# Patient Record
Sex: Male | Born: 1935 | Race: White | Hispanic: No | Marital: Married | Smoking: Never smoker
Health system: Southern US, Community
[De-identification: ages and names within clinical notes are randomized; demographics above are authoritative.]

## PROBLEM LIST (undated history)

## (undated) DIAGNOSIS — I10 Essential (primary) hypertension: Secondary | ICD-10-CM

## (undated) DIAGNOSIS — E785 Hyperlipidemia, unspecified: Secondary | ICD-10-CM

## (undated) DIAGNOSIS — I2510 Atherosclerotic heart disease of native coronary artery without angina pectoris: Secondary | ICD-10-CM

## (undated) HISTORY — PX: ARTERIAL BYPASS SURGRY: SHX557

## (undated) HISTORY — PX: HERNIA REPAIR: SHX51

---

## 2015-10-05 ENCOUNTER — Inpatient Hospital Stay
Admission: EM | Admit: 2015-10-05 | Discharge: 2015-10-08 | DRG: 872 | Disposition: A | Discharge status: Home / Self Care | Payer: Medicare Other | Attending: Internal Medicine | Admitting: Internal Medicine

## 2015-10-05 ENCOUNTER — Encounter: Payer: Self-pay | Admitting: Medical Oncology

## 2015-10-05 ENCOUNTER — Emergency Department: Payer: Medicare Other

## 2015-10-05 DIAGNOSIS — A419 Sepsis, unspecified organism: Secondary | ICD-10-CM | POA: Diagnosis present

## 2015-10-05 DIAGNOSIS — E860 Dehydration: Secondary | ICD-10-CM | POA: Diagnosis present

## 2015-10-05 DIAGNOSIS — Z79899 Other long term (current) drug therapy: Secondary | ICD-10-CM

## 2015-10-05 DIAGNOSIS — I959 Hypotension, unspecified: Secondary | ICD-10-CM

## 2015-10-05 DIAGNOSIS — A412 Sepsis due to unspecified staphylococcus: Secondary | ICD-10-CM | POA: Diagnosis not present

## 2015-10-05 DIAGNOSIS — N390 Urinary tract infection, site not specified: Secondary | ICD-10-CM | POA: Diagnosis present

## 2015-10-05 DIAGNOSIS — R7989 Other specified abnormal findings of blood chemistry: Secondary | ICD-10-CM

## 2015-10-05 DIAGNOSIS — Z8249 Family history of ischemic heart disease and other diseases of the circulatory system: Secondary | ICD-10-CM | POA: Diagnosis not present

## 2015-10-05 DIAGNOSIS — I10 Essential (primary) hypertension: Secondary | ICD-10-CM | POA: Diagnosis present

## 2015-10-05 DIAGNOSIS — I2510 Atherosclerotic heart disease of native coronary artery without angina pectoris: Secondary | ICD-10-CM | POA: Diagnosis present

## 2015-10-05 DIAGNOSIS — E876 Hypokalemia: Secondary | ICD-10-CM | POA: Diagnosis present

## 2015-10-05 DIAGNOSIS — R17 Unspecified jaundice: Secondary | ICD-10-CM | POA: Diagnosis present

## 2015-10-05 DIAGNOSIS — N179 Acute kidney failure, unspecified: Secondary | ICD-10-CM | POA: Diagnosis present

## 2015-10-05 DIAGNOSIS — E785 Hyperlipidemia, unspecified: Secondary | ICD-10-CM | POA: Diagnosis present

## 2015-10-05 DIAGNOSIS — E871 Hypo-osmolality and hyponatremia: Secondary | ICD-10-CM | POA: Diagnosis present

## 2015-10-05 DIAGNOSIS — Z7982 Long term (current) use of aspirin: Secondary | ICD-10-CM

## 2015-10-05 DIAGNOSIS — Z951 Presence of aortocoronary bypass graft: Secondary | ICD-10-CM | POA: Diagnosis not present

## 2015-10-05 DIAGNOSIS — R197 Diarrhea, unspecified: Secondary | ICD-10-CM | POA: Diagnosis present

## 2015-10-05 DIAGNOSIS — R778 Other specified abnormalities of plasma proteins: Secondary | ICD-10-CM

## 2015-10-05 DIAGNOSIS — R748 Abnormal levels of other serum enzymes: Secondary | ICD-10-CM | POA: Diagnosis present

## 2015-10-05 HISTORY — DX: Atherosclerotic heart disease of native coronary artery without angina pectoris: I25.10

## 2015-10-05 HISTORY — DX: Hyperlipidemia, unspecified: E78.5

## 2015-10-05 HISTORY — DX: Essential (primary) hypertension: I10

## 2015-10-05 LAB — URINALYSIS COMPLETE WITH MICROSCOPIC (ARMC ONLY)
BACTERIA UA: NONE SEEN
Bilirubin Urine: NEGATIVE
Glucose, UA: NEGATIVE mg/dL
Ketones, ur: NEGATIVE mg/dL
NITRITE: NEGATIVE
PH: 6 (ref 5.0–8.0)
PROTEIN: NEGATIVE mg/dL
Specific Gravity, Urine: 1.013 (ref 1.005–1.030)
Squamous Epithelial / LPF: NONE SEEN

## 2015-10-05 LAB — COMPREHENSIVE METABOLIC PANEL
ALBUMIN: 3.3 g/dL — AB (ref 3.5–5.0)
ALT: 20 U/L (ref 17–63)
AST: 32 U/L (ref 15–41)
Alkaline Phosphatase: 68 U/L (ref 38–126)
Anion gap: 7 (ref 5–15)
BUN: 22 mg/dL — AB (ref 6–20)
CHLORIDE: 98 mmol/L — AB (ref 101–111)
CO2: 29 mmol/L (ref 22–32)
Calcium: 8.2 mg/dL — ABNORMAL LOW (ref 8.9–10.3)
Creatinine, Ser: 1.9 mg/dL — ABNORMAL HIGH (ref 0.61–1.24)
GFR calc Af Amer: 37 mL/min — ABNORMAL LOW (ref >60)
GFR, EST NON AFRICAN AMERICAN: 32 mL/min — AB (ref >60)
Glucose, Bld: 143 mg/dL — ABNORMAL HIGH (ref 65–99)
POTASSIUM: 3.2 mmol/L — AB (ref 3.5–5.1)
SODIUM: 134 mmol/L — AB (ref 135–145)
Total Bilirubin: 2.4 mg/dL — ABNORMAL HIGH (ref 0.3–1.2)
Total Protein: 6.4 g/dL — ABNORMAL LOW (ref 6.5–8.1)

## 2015-10-05 LAB — CBC WITH DIFFERENTIAL/PLATELET
BASOS ABS: 0.2 10*3/uL — AB (ref 0–0.1)
BASOS PCT: 1 %
Eosinophils Absolute: 0 10*3/uL (ref 0–0.7)
Eosinophils Relative: 0 %
HEMATOCRIT: 33.4 % — AB (ref 40.0–52.0)
HEMOGLOBIN: 11.3 g/dL — AB (ref 13.0–18.0)
LYMPHS PCT: 2 %
Lymphs Abs: 0.4 10*3/uL — ABNORMAL LOW (ref 1.0–3.6)
MCH: 29.8 pg (ref 26.0–34.0)
MCHC: 33.9 g/dL (ref 32.0–36.0)
MCV: 87.9 fL (ref 80.0–100.0)
MONOS PCT: 4 %
Monocytes Absolute: 0.8 10*3/uL (ref 0.2–1.0)
NEUTROS ABS: 20.1 10*3/uL — AB (ref 1.4–6.5)
NEUTROS PCT: 93 %
Platelets: 193 10*3/uL (ref 150–440)
RBC: 3.79 MIL/uL — ABNORMAL LOW (ref 4.40–5.90)
RDW: 14.4 % (ref 11.5–14.5)
WBC: 21.5 10*3/uL — ABNORMAL HIGH (ref 3.8–10.6)

## 2015-10-05 LAB — APTT: APTT: 33 s (ref 24–36)

## 2015-10-05 LAB — PROTIME-INR
INR: 1.22
PROTHROMBIN TIME: 15.6 s — AB (ref 11.4–15.0)

## 2015-10-05 LAB — LACTIC ACID, PLASMA
LACTIC ACID, VENOUS: 1.3 mmol/L (ref 0.5–2.0)
LACTIC ACID, VENOUS: 1.2 mmol/L (ref 0.5–2.0)

## 2015-10-05 LAB — LIPASE, BLOOD: LIPASE: 20 U/L (ref 11–51)

## 2015-10-05 LAB — TROPONIN I: TROPONIN I: 0.09 ng/mL — AB (ref <0.031)

## 2015-10-05 MED ORDER — ATORVASTATIN CALCIUM 20 MG PO TABS
80.0000 mg | ORAL_TABLET | Freq: Every day | ORAL | Status: DC
  Administered 2015-10-05 – 2015-10-07 (×3): 80 mg via ORAL
  Filled 2015-10-05 (×3): qty 4

## 2015-10-05 MED ORDER — OMEGA-3-ACID ETHYL ESTERS 1 G PO CAPS
1.0000 g | ORAL_CAPSULE | Freq: Every day | ORAL | Status: DC
  Administered 2015-10-05 – 2015-10-07 (×3): 1 g via ORAL
  Filled 2015-10-05 (×3): qty 1

## 2015-10-05 MED ORDER — ENOXAPARIN SODIUM 40 MG/0.4ML ~~LOC~~ SOLN
40.0000 mg | SUBCUTANEOUS | Status: DC
  Administered 2015-10-07: 40 mg via SUBCUTANEOUS
  Filled 2015-10-05 (×2): qty 0.4

## 2015-10-05 MED ORDER — MORPHINE SULFATE (PF) 2 MG/ML IV SOLN: 2.0000 mg | INTRAVENOUS | Status: DC | PRN

## 2015-10-05 MED ORDER — ONDANSETRON HCL 4 MG PO TABS: 4.0000 mg | ORAL_TABLET | Freq: Four times a day (QID) | ORAL | Status: DC | PRN

## 2015-10-05 MED ORDER — SODIUM CHLORIDE 0.9 % IV BOLUS (SEPSIS)
1000.0000 mL | INTRAVENOUS | Status: AC
  Administered 2015-10-05 (×2): 1000 mL via INTRAVENOUS

## 2015-10-05 MED ORDER — ONDANSETRON HCL 4 MG/2ML IJ SOLN: 4.0000 mg | Freq: Four times a day (QID) | INTRAMUSCULAR | Status: DC | PRN

## 2015-10-05 MED ORDER — POTASSIUM CHLORIDE CRYS ER 20 MEQ PO TBCR
40.0000 meq | EXTENDED_RELEASE_TABLET | Freq: Once | ORAL | Status: AC
  Administered 2015-10-05: 40 meq via ORAL
  Filled 2015-10-05: qty 2

## 2015-10-05 MED ORDER — OXYCODONE HCL 5 MG PO TABS: 5.0000 mg | ORAL_TABLET | ORAL | Status: DC | PRN

## 2015-10-05 MED ORDER — LEVOTHYROXINE SODIUM 75 MCG PO TABS
75.0000 ug | ORAL_TABLET | Freq: Every day | ORAL | Status: DC
  Administered 2015-10-06 – 2015-10-08 (×3): 75 ug via ORAL
  Filled 2015-10-05 (×3): qty 1

## 2015-10-05 MED ORDER — SODIUM CHLORIDE 0.9 % IV SOLN
INTRAVENOUS | Status: DC
  Administered 2015-10-05 – 2015-10-08 (×6): via INTRAVENOUS

## 2015-10-05 MED ORDER — ACETAMINOPHEN 650 MG RE SUPP: 650.0000 mg | Freq: Four times a day (QID) | RECTAL | Status: DC | PRN

## 2015-10-05 MED ORDER — PIPERACILLIN-TAZOBACTAM 3.375 G IVPB: 3.3750 g | Freq: Three times a day (TID) | INTRAVENOUS | Status: DC

## 2015-10-05 MED ORDER — DEXTROSE 5 % IV SOLN
1.0000 g | INTRAVENOUS | Status: DC
  Administered 2015-10-05: 1 g via INTRAVENOUS
  Filled 2015-10-05 (×2): qty 10

## 2015-10-05 MED ORDER — ACETAMINOPHEN 325 MG PO TABS
650.0000 mg | ORAL_TABLET | Freq: Four times a day (QID) | ORAL | Status: DC | PRN
  Administered 2015-10-05: 650 mg via ORAL
  Filled 2015-10-05: qty 2

## 2015-10-05 MED ORDER — VANCOMYCIN HCL IN DEXTROSE 1-5 GM/200ML-% IV SOLN
1000.0000 mg | Freq: Once | INTRAVENOUS | Status: AC
Start: 2015-10-05 — End: 2015-10-05
  Administered 2015-10-05: 1000 mg via INTRAVENOUS
  Filled 2015-10-05: qty 200

## 2015-10-05 MED ORDER — ZOLPIDEM TARTRATE 5 MG PO TABS
5.0000 mg | ORAL_TABLET | Freq: Every evening | ORAL | Status: DC | PRN
  Administered 2015-10-08: 5 mg via ORAL
  Filled 2015-10-05: qty 1

## 2015-10-05 MED ORDER — CINNAMON 500 MG PO CAPS: 500.0000 mg | ORAL_CAPSULE | Freq: Every day | ORAL | Status: DC

## 2015-10-05 MED ORDER — ENOXAPARIN SODIUM 40 MG/0.4ML ~~LOC~~ SOLN
40.0000 mg | SUBCUTANEOUS | Status: DC
  Administered 2015-10-05: 40 mg via SUBCUTANEOUS

## 2015-10-05 MED ORDER — PANTOPRAZOLE SODIUM 40 MG PO TBEC
40.0000 mg | DELAYED_RELEASE_TABLET | Freq: Every day | ORAL | Status: DC
  Administered 2015-10-05 – 2015-10-08 (×4): 40 mg via ORAL
  Filled 2015-10-05 (×4): qty 1

## 2015-10-05 MED ORDER — DEXTROSE 5 % IV SOLN
1.0000 g | INTRAVENOUS | Status: DC
  Filled 2015-10-05: qty 10

## 2015-10-05 MED ORDER — PIPERACILLIN-TAZOBACTAM 3.375 G IVPB 30 MIN
3.3750 g | Freq: Once | INTRAVENOUS | Status: AC
  Administered 2015-10-05: 3.375 g via INTRAVENOUS
  Filled 2015-10-05: qty 50

## 2015-10-05 MED ORDER — ASPIRIN EC 81 MG PO TBEC
81.0000 mg | DELAYED_RELEASE_TABLET | Freq: Every day | ORAL | Status: DC
  Administered 2015-10-05 – 2015-10-07 (×3): 81 mg via ORAL
  Filled 2015-10-05 (×3): qty 1

## 2015-10-05 MED ORDER — ENOXAPARIN SODIUM 40 MG/0.4ML ~~LOC~~ SOLN
40.0000 mg | SUBCUTANEOUS | Status: DC
  Filled 2015-10-05 (×2): qty 0.4

## 2015-10-05 NOTE — H&P (Signed)
[facility] - [place] at [facility]   PATIENT NAME: Shane Peters    MR#:  [id number]  DATE OF BIRTH:  08/23/1933   DATE OF ADMISSION:  10/05/2015  PRIMARY CARE PHYSICIAN: Delton Prairieobin, Paul, MD   REQUESTING/REFERRING PHYSICIAN: York CeriseForbach  CHIEF COMPLAINT:   Chief Complaint  Patient presents with  . Fever    HISTORY OF PRESENT ILLNESS:  Shane Peters  is a 82 y.o. male with a known history of Essential hypertension, hyperlipidemia unspecified presenting with fever and fatigue weakness. Recent viral illness described as "stomach bug" including nauseous vomiting diarrhea symptoms have completely resolved less than 1 week ago. Now describes one day duration fever or chills fatigue weakness. Had episode of near-syncope when going from sitting to standing position. Temperature 103.5 at home. Sent to Hospital further workup and evaluation. Emergency department course: Code sepsis  PAST MEDICAL HISTORY:   Past Medical History  Diagnosis Date  . Hypertension   . Hyperlipidemia   . Coronary artery disease     PAST SURGICAL HISTORY:   Past Surgical History  Procedure Laterality Date  . Arterial bypass surgry    . Hernia repair      SOCIAL HISTORY:   Social History  Substance Use Topics  . Smoking status: Never Smoker   . Smokeless tobacco: Not on file  . Alcohol Use: No    FAMILY HISTORY:   Family History  Problem Relation Age of Onset  . Hypertension Other     DRUG ALLERGIES:  No Known Allergies  REVIEW OF SYSTEMS:  REVIEW OF SYSTEMS:  CONSTITUTIONAL: Positive fevers, chills, fatigue, weakness.  EYES: Denies blurred vision, double vision, or eye pain.  EARS, NOSE, THROAT: Denies tinnitus, ear pain, hearing loss.  RESPIRATORY: denies cough, shortness of breath, wheezing  CARDIOVASCULAR: Denies chest pain, palpitations, edema.  GASTROINTESTINAL: Denies nausea, vomiting, diarrhea, abdominal pain.  GENITOURINARY: Denies dysuria, hematuria.    ENDOCRINE: Denies nocturia or thyroid problems. HEMATOLOGIC AND LYMPHATIC: Denies easy bruising or bleeding.  SKIN: Denies rash or lesions.  MUSCULOSKELETAL: Denies pain in neck, back, shoulder, knees, hips, or further arthritic symptoms.  NEUROLOGIC: Denies paralysis, paresthesias.  PSYCHIATRIC: Denies anxiety or depressive symptoms. Otherwise full review of systems performed by me is negative.   MEDICATIONS AT HOME:   Prior to Admission medications   Medication Sig Start Date End Date Taking? Authorizing Provider  aspirin EC 81 MG tablet Take 81 mg by mouth at bedtime.   Yes Historical Provider, MD  atenolol (TENORMIN) 100 MG tablet Take 100 mg by mouth daily.   Yes Historical Provider, MD  atorvastatin (LIPITOR) 80 MG tablet Take 80 mg by mouth at bedtime.    Yes Historical Provider, MD  Cinnamon 500 MG capsule Take 500 mg by mouth at bedtime.   Yes Historical Provider, MD  esomeprazole (NEXIUM) 20 MG capsule Take 20 mg by mouth daily before breakfast.   Yes Historical Provider, MD  hydrochlorothiazide (MICROZIDE) 12.5 MG capsule Take 12.5 mg by mouth daily.   Yes Historical Provider, MD  levothyroxine (SYNTHROID, LEVOTHROID) 75 MCG tablet Take 75 mcg by mouth daily before breakfast.   Yes Historical Provider, MD  omega-3 acid ethyl esters (LOVAZA) 1 g capsule Take 1 g by mouth at bedtime.   Yes Historical Provider, MD      VITAL SIGNS:  Blood pressure 110/52, pulse 62, temperature 98.1 F (36.7 C), temperature source Oral, resp. rate 18, height 6' (1.829 m), weight 88.451 kg (195 lb), SpO2 99 %.  PHYSICAL EXAMINATION:  VITAL SIGNS: Filed Vitals:   10/05/15 1414 10/05/15 1430  BP: 101/60 110/52  Pulse: 64 62  Temp:    Resp: 18 18   GENERAL:81 y.o.male currently Weak appearing  HEAD: Normocephalic, atraumatic.  EYES: Pupils equal, round, reactive to light. Extraocular muscles intact. No scleral icterus.  MOUTH: Moist mucosal membrane. Dentition intact. No abscess noted.   EAR, NOSE, THROAT: Clear without exudates. No external lesions.  NECK: Supple. No thyromegaly. No nodules. No JVD.  PULMONARY: Clear to ascultation, without wheeze rails or rhonci. No use of accessory muscles, Good respiratory effort. good air entry bilaterally CHEST: Nontender to palpation.  CARDIOVASCULAR: S1 and S2. Regular rate and rhythm. No murmurs, rubs, or gallops. No edema. Pedal pulses 2+ bilaterally.  GASTROINTESTINAL: Soft, nontender, nondistended. No masses. Positive bowel sounds. No hepatosplenomegaly.  MUSCULOSKELETAL: No swelling, clubbing, or edema. Range of motion full in all extremities.  NEUROLOGIC: Cranial nerves II through XII are intact. No gross focal neurological deficits. Sensation intact. Reflexes intact.  SKIN: No ulceration, lesions, rashes, or cyanosis. Skin warm and dry. Turgor intact.  PSYCHIATRIC: Mood, affect within normal limits. The patient is awake, alert and oriented x 3. Insight, judgment intact.    LABORATORY PANEL:   CBC  Recent Labs Lab 10/05/15 1231  WBC 21.5*  HGB 11.3*  HCT 33.4*  PLT 193   ------------------------------------------------------------------------------------------------------------------  Chemistries   Recent Labs Lab 10/05/15 1231  NA 134*  K 3.2*  CL 98*  CO2 29  GLUCOSE 143*  BUN 22*  CREATININE 1.90*  CALCIUM 8.2*  AST 32  ALT 20  ALKPHOS 68  BILITOT 2.4*   ------------------------------------------------------------------------------------------------------------------  Cardiac Enzymes  Recent Labs Lab 10/05/15 1231  TROPONINI 0.09*   ------------------------------------------------------------------------------------------------------------------  RADIOLOGY:  Dg Chest Port 1 View  10/05/2015  CLINICAL DATA:  Sepsis EXAM: PORTABLE CHEST 1 VIEW COMPARISON:  None. FINDINGS: Borderline cardiomegaly. Status post median sternotomy. No acute infiltrate or pleural effusion. No pulmonary edema. Mild  basilar atelectasis. IMPRESSION: Borderline cardiomegaly. Status post median sternotomy. Mild basilar atelectasis. Electronically Signed   By: Natasha Mead M.D.   On: 10/05/2015 12:52    EKG:   Orders placed or performed during the hospital encounter of 10/05/15  . EKG 12-Lead  . EKG 12-Lead  . EKG 12-Lead  . EKG 12-Lead    IMPRESSION AND PLAN:   82 year old gentleman history of hypothyroidism unspecified hypertension essential. Presenting with fever.  1.Sepsis, meeting septic criteria by temperature, leukocytosis present on arrival. Source urinary tract infection site unspecified Code sepsis initiated. Panculture. Broad-spectrum antibiotics including ceftriaxone and taper antibiotics when culture data returns.  Has received a 30 mL/kg IV fluid bolus. Continue IV fluid hydration to keep mean arterial pressure greater than 65. may require pressor therapy if blood pressure worsens. We will repeat lactic acid if the initial is greater than 2.2.  2. Hyponatremia: IV fluid hydration follow sodium level III. Hypokalemia replace potassium goal 4-5 4. Essential hypertension: Hold oral medications given relative hypotension 5. Hyperlipidemia specified Lipitor 5. Venous thromboembolism prophylactic: Lovenox     All the records are reviewed and case discussed with ED provider. Management plans discussed with the patient, family and they are in agreement.  CODE STATUS: Full  TOTAL TIME TAKING CARE OF THIS PATIENT: 33 minutes.    Cormac Wint,  Mardi Mainland.D on 10/05/2015 at 2:54 PM  Between 7am to 6pm - Pager - [phone]  After 6pm: House Pager: - [phone]  [facility]  Office  [phone]  CC: Primary care physician; Juanell Fairly, MD

## 2015-10-05 NOTE — Progress Notes (Signed)
PHARMACIST - PHYSICIAN ORDER COMMUNICATION  CONCERNING: P&T Medication Policy on Herbal Medications  DESCRIPTION:  This patient's order for:  Cinnamon  has been noted.  This product(s) is classified as an "herbal" or natural product. Due to a lack of definitive safety studies or FDA approval, nonstandard manufacturing practices, plus the potential risk of unknown drug-drug interactions while on inpatient medications, the Pharmacy and Therapeutics Committee does not permit the use of "herbal" or natural products of this type within [place].   ACTION TAKEN: The pharmacy department is unable to verify this order at this time and your patient has been informed of this safety policy. Please reevaluate patient's clinical condition at discharge and address if the herbal or natural product(s) should be resumed at that time.  

## 2015-10-05 NOTE — ED Provider Notes (Signed)
[facility] Emergency Department Provider Note  ____________________________________________  Time seen: Approximately 12:17 PM  I have reviewed the triage vital signs and the nursing notes.   HISTORY  Chief Complaint Fever    HPI Shane Peters is a 82 y.o. male with past medical history of coronary artery bypass graft, hypertension, and hyperlipidemiawho presents by private vehicle with fever to 103 at home this morning, general malaise, lethargy, and generalized weakness.  He had his wife report that he had about 3 days of diarrhea but that was last week.  He saw his PCP 1 week ago and the diarrhea has resolved, but he has continued to feel "drained" and "run down".  Yesterday they went out shopping and he became tremulous and felt very weak.  EMTs were called to the store and he felt better after some by mouth fluids and he went home.  He had a subjective low-grade fever last night.  When he awoke this morning he was so weak that he tried to get out of bed but he could not support himself and he collapsed.  He did not strike his head and did not lose consciousness.  He has had no focal numbness nor weakness of any of his extremities, just generalized weakness.  His wife reports that his fever was 103 this morning.  She gave him some Tylenol and then took him to an urgent care.  At the urgent care and they found that he was hypotensive and weak and suggested they call an ambulance, but his wife preferred to bring him to the ED by private vehicle.  The patient denies headache, chest pain, shortness of breath, nausea, vomiting, and diarrhea (since it resolved about a week ago).  He has had less by mouth intake recently and less appetite.  He denies dysuria and says that his urinary habits have been normal.  Has had subjective fever and chills.  Nothing makes his symptoms better and nothing makes them worse.  He takes atenolol prescribed by his cardiologist and has had  no changes of his medications recently.   Past Medical History  Diagnosis Date  . Hypertension   . Hyperlipidemia   . Coronary artery disease     There are no active problems to display for this patient.   Past Surgical History  Procedure Laterality Date  . Arterial bypass surgry    . Hernia repair      Current Outpatient Rx  Name  Route  Sig  Dispense  Refill  . aspirin EC 81 MG tablet   Oral   Take 81 mg by mouth at bedtime.         Marland Kitchen. atenolol (TENORMIN) 100 MG tablet   Oral   Take 100 mg by mouth daily.         Marland Kitchen. atorvastatin (LIPITOR) 80 MG tablet   Oral   Take 80 mg by mouth at bedtime.          . Cinnamon 500 MG capsule   Oral   Take 500 mg by mouth at bedtime.         Marland Kitchen. esomeprazole (NEXIUM) 20 MG capsule   Oral   Take 20 mg by mouth daily before breakfast.         . hydrochlorothiazide (MICROZIDE) 12.5 MG capsule   Oral   Take 12.5 mg by mouth daily.         Marland Kitchen. levothyroxine (SYNTHROID, LEVOTHROID) 75 MCG tablet   Oral   Take 75  mcg by mouth daily before breakfast.         . omega-3 acid ethyl esters (LOVAZA) 1 g capsule   Oral   Take 1 g by mouth at bedtime.           Allergies Review of patient's allergies indicates no known allergies.  No family history on file.  Social History Social History  Substance Use Topics  . Smoking status: Never Smoker   . Smokeless tobacco: None  . Alcohol Use: No    Review of Systems Constitutional: +fever/chills.  General malaise, fatigue, lethargy Eyes: No visual changes. ENT: No sore throat. Cardiovascular: Denies chest pain. Respiratory: Denies shortness of breath. Gastrointestinal: No abdominal pain.  No nausea, no vomiting.  No diarrhea Since last week.  No constipation.  Decreased appetite Genitourinary: Negative for dysuria. Musculoskeletal: Negative for back pain. Skin: Negative for rash. Neurological: Negative for headaches, focal weakness or numbness.  Generalized weakness,  unable to walk earlier today because he could not support himself  10-point ROS otherwise negative.  ____________________________________________   PHYSICAL EXAM:  VITAL SIGNS: ED Triage Vitals  Enc Vitals Group     BP 10/05/15 1155 93/45 mmHg     Pulse Rate 10/05/15 1155 65     Resp 10/05/15 1155 18     Temp 10/05/15 1155 98.1 F (36.7 C)     Temp Source 10/05/15 1155 Oral     SpO2 10/05/15 1155 96 %     Weight 10/05/15 1155 195 lb (88.451 kg)     Height 10/05/15 1155 6' (1.829 m)     Head Cir --      Peak Flow --      Pain Score 10/05/15 1201 0     Pain Loc --      Pain Edu? --      Excl. in GC? --     Constitutional: Alert and oriented. Somewhat ill appearing but nontoxic and in no acute distress Eyes: Conjunctivae are normal. PERRL. EOMI. Head: Atraumatic. Nose: No congestion/rhinnorhea. Mouth/Throat: Mucous membranes are moist.  Oropharynx non-erythematous. Neck: No stridor.  No meningeal signs.   Cardiovascular: Normal rate, regular rhythm. Good peripheral circulation. Grossly normal heart sounds.   Respiratory: Normal respiratory effort.  No retractions. Lungs CTAB. Gastrointestinal: Soft and nontender. No distention.  Musculoskeletal: No lower extremity tenderness nor edema. No gross deformities of extremities. Neurologic:  Normal speech and language. No gross focal neurologic deficits are appreciated.  Skin:  Skin is warm, dry and intact. No rash noted.  Pale. Psychiatric: Mood and affect are normal. Speech and behavior are normal.  ____________________________________________   LABS (all labs ordered are listed, but only abnormal results are displayed)  Labs Reviewed  COMPREHENSIVE METABOLIC PANEL - Abnormal; Notable for the following:    Sodium 134 (*)    Potassium 3.2 (*)    Chloride 98 (*)    Glucose, Bld 143 (*)    BUN 22 (*)    Creatinine, Ser 1.90 (*)    Calcium 8.2 (*)    Total Protein 6.4 (*)    Albumin 3.3 (*)    Total Bilirubin 2.4 (*)     GFR calc non Af Amer 32 (*)    GFR calc Af Amer 37 (*)    All other components within normal limits  TROPONIN I - Abnormal; Notable for the following:    Troponin I 0.09 (*)    All other components within normal limits  CBC WITH DIFFERENTIAL/PLATELET - Abnormal; Notable for  the following:    WBC 21.5 (*)    RBC 3.79 (*)    Hemoglobin 11.3 (*)    HCT 33.4 (*)    Neutro Abs 20.1 (*)    Lymphs Abs 0.4 (*)    Basophils Absolute 0.2 (*)    All other components within normal limits  URINALYSIS COMPLETEWITH MICROSCOPIC ([facility] ONLY) - Abnormal; Notable for the following:    Color, Urine YELLOW (*)    APPearance HAZY (*)    Hgb urine dipstick 1+ (*)    Leukocytes, UA 2+ (*)    All other components within normal limits  PROTIME-INR - Abnormal; Notable for the following:    Prothrombin Time 15.6 (*)    All other components within normal limits  CULTURE, BLOOD (ROUTINE X 2)  CULTURE, BLOOD (ROUTINE X 2)  URINE CULTURE  LACTIC ACID, PLASMA  LIPASE, BLOOD  APTT  LACTIC ACID, PLASMA   ____________________________________________  EKG  ED ECG REPORT I, Zaara Sprowl, the attending physician, personally viewed and interpreted this ECG.   Date: 10/05/2015  EKG Time: 12:10  Rate: 67  Rhythm: normal sinus rhythm  Axis: Left axis deviation  Intervals:Incomplete right bundle-branch block  ST&T Change: Non-specific ST segment / T-wave changes, but no evidence of acute ischemia.  ____________________________________________  RADIOLOGY   Dg Chest Port 1 View  10/05/2015  CLINICAL DATA:  Sepsis EXAM: PORTABLE CHEST 1 VIEW COMPARISON:  None. FINDINGS: Borderline cardiomegaly. Status post median sternotomy. No acute infiltrate or pleural effusion. No pulmonary edema. Mild basilar atelectasis. IMPRESSION: Borderline cardiomegaly. Status post median sternotomy. Mild basilar atelectasis. Electronically Signed   By: Natasha Mead M.D.   On: 10/05/2015 12:52     ____________________________________________   PROCEDURES  Procedure(s) performed: None  Critical Care performed: Yes, see critical care note(s)   CRITICAL CARE Performed by: Loleta Rose   Total critical care time: 45 minutes  Critical care time was exclusive of separately billable procedures and treating other patients.  Critical care was necessary to treat or prevent imminent or life-threatening deterioration.  Critical care was time spent personally by me on the following activities: development of treatment plan with patient and/or surrogate as well as nursing, discussions with consultants, evaluation of patient's response to treatment, examination of patient, obtaining history from patient or surrogate, ordering and performing treatments and interventions, ordering and review of laboratory studies, ordering and review of radiographic studies, pulse oximetry and re-evaluation of patient's condition.  ____________________________________________   INITIAL IMPRESSION / ASSESSMENT AND PLAN / ED COURSE  Pertinent labs & imaging results that were available during my care of the patient were reviewed by me and considered in my medical decision making (see chart for details).  The patient is somewhat ill appearing but nontoxic.  He is alert and oriented 3.  However he is hypertensive and on atenolol so he is not demonstrating the tachycardia I suspect he would otherwise.  He was febrile to 103 this morning.  At this point he does not meet sepsis criteria but I am going to proceed with a full sepsis workup given the probability we will find a source, though I will hold off on empiric antibiotics given that he is currently afebrile.  I will, however, start 30 mL/kg of IV fluids (the patient has no history of congestive heart failure).  I discussed the broad workup with the patient's family and they understand and agree.  He is in no acute distress at this time and does not require  either  anti-emetics or analgesia.  ----------------------------------------- 1:07 PM on 10/05/2015 -----------------------------------------  His CBC has resulted with a leukocytosis of greater than 21.  As result I have officially activity could sepsis and will give empiric antibiotics (Zosyn and vancomycin).  Lactate still pending, fluids running, blood cultures obtained, patient attempting to urinate now but nurse well and and out catheterized if needed.  ----------------------------------------- 1:55 PM on 10/05/2015 -----------------------------------------  Lactate within normal limits, but given the patient remains borderline hypertensive I will continue fluids as ordered.  Also a review of a recent stress test shows a normal ejection fraction, so the fluids are more likely to do good than harm.  His labs are notable for a very strongly positive urinary tract infection, probable acute kidney injury due to volume depletion, and a hyperbilirubinemia of uncertain significance given that he has no tenderness to palpation of his abdomen.  I discussed the case with the hospitalist who will admit.  I updated the patient and family about his sepsis and urinary tract infection diagnosis.  He does have a slightly elevated troponin level but I believe this is a stress leak due to his dehydration and sepsis.  He has had no chest pain or shortness of breath and it would not be appropriate to start him on heparin at this time. ____________________________________________  FINAL CLINICAL IMPRESSION(S) / ED DIAGNOSES  Final diagnoses:  Sepsis, due to unspecified organism [facility])  Urinary tract infection without hematuria, site unspecified  Hypotension, unspecified hypotension type  Acute kidney injury (HCC)  Dehydration  Hyperbilirubinemia  Elevated troponin I level      NEW MEDICATIONS STARTED DURING THIS VISIT:  New Prescriptions   No medications on file      Note:  This document was  prepared using Dragon voice recognition software and may include unintentional dictation errors.   Loleta Rose, MD 10/05/15 [phone]

## 2015-10-05 NOTE — Progress Notes (Signed)
Pharmacist - Prescriber Communication  Per [facility] Policy, the order for diphenhydramine 25 mg po at bedtime PRN sleep has been changed to zolpidem 5 mg for patient over 80 years old. Please call pharmacy if you have any questions about this substitution.  Carola FrostNathan A Pearlie Nies, Pharm.D., BCPS Clinical Pharmacist 10/05/2015 2320

## 2015-10-05 NOTE — ED Notes (Signed)
Pt with wife to triage with reports that pt began last week with stomach bug which resolved. Yesterday pt began having chills, this am pts wife reports pt had fever of 103. Tylenol was taken at 0800. Pt reports weakness.

## 2015-10-05 NOTE — Progress Notes (Signed)
Pt admitted to rm 234. He is alert ad oriented and painfree. No liq stools for >24 hrs. Had formed bm this morning. Pt is usually a very active and independent man. He is mod falls risk. States he will summon help getting out of bed. Oriented to room. Wife at bedside.

## 2015-10-05 NOTE — ED Notes (Signed)
Pt denies CP, SOB, or n/v/d at this time.  Pt sts that he has not been eating/drinking like normal since stomach bug x 1 week ago.  Pts wife also sts that pt fell when getting up from bed this AM.  Pt sts that he "slid out of bed", but unable to to elaborate further.  Wife sts hat pt is at normal LOC.

## 2015-10-05 NOTE — Progress Notes (Signed)
MD paged, no orders for cardiac monitoring but pt is currently on telemetry, Dr. Anne HahnWillis to put in order for telemetry. & also to draw serial troponins since first one came back positive but was not repeated. Also pt requesting something for sleep, Dr. Anne HahnWillis to put in orders for something for sleep as well. Will continue to monitor. Shirley FriarAlexis Miller, RN

## 2015-10-06 ENCOUNTER — Inpatient Hospital Stay: Payer: Medicare Other

## 2015-10-06 LAB — URINALYSIS COMPLETE WITH MICROSCOPIC (ARMC ONLY)
Bilirubin Urine: NEGATIVE
Glucose, UA: NEGATIVE mg/dL
KETONES UR: NEGATIVE mg/dL
Nitrite: NEGATIVE
PH: 6 (ref 5.0–8.0)
PROTEIN: NEGATIVE mg/dL
SPECIFIC GRAVITY, URINE: 1.013 (ref 1.005–1.030)

## 2015-10-06 LAB — BLOOD CULTURE ID PANEL (REFLEXED)
Acinetobacter baumannii: NOT DETECTED
CANDIDA ALBICANS: NOT DETECTED
CANDIDA PARAPSILOSIS: NOT DETECTED
CANDIDA TROPICALIS: NOT DETECTED
CARBAPENEM RESISTANCE: NOT DETECTED
Candida glabrata: NOT DETECTED
Candida krusei: NOT DETECTED
ENTEROBACTER CLOACAE COMPLEX: NOT DETECTED
ENTEROBACTERIACEAE SPECIES: NOT DETECTED
ENTEROCOCCUS SPECIES: NOT DETECTED
Escherichia coli: NOT DETECTED
HAEMOPHILUS INFLUENZAE: NOT DETECTED
KLEBSIELLA PNEUMONIAE: NOT DETECTED
Klebsiella oxytoca: NOT DETECTED
Listeria monocytogenes: NOT DETECTED
METHICILLIN RESISTANCE: NOT DETECTED
Neisseria meningitidis: NOT DETECTED
PROTEUS SPECIES: NOT DETECTED
PSEUDOMONAS AERUGINOSA: NOT DETECTED
STAPHYLOCOCCUS AUREUS BCID: NOT DETECTED
STREPTOCOCCUS PNEUMONIAE: NOT DETECTED
STREPTOCOCCUS PYOGENES: NOT DETECTED
Serratia marcescens: NOT DETECTED
Staphylococcus species: DETECTED — AB
Streptococcus agalactiae: NOT DETECTED
Streptococcus species: NOT DETECTED
VANCOMYCIN RESISTANCE: NOT DETECTED

## 2015-10-06 LAB — CBC
HEMATOCRIT: 27.8 % — AB (ref 40.0–52.0)
HEMOGLOBIN: 9.7 g/dL — AB (ref 13.0–18.0)
MCH: 30.7 pg (ref 26.0–34.0)
MCHC: 34.9 g/dL (ref 32.0–36.0)
MCV: 88 fL (ref 80.0–100.0)
Platelets: 140 10*3/uL — ABNORMAL LOW (ref 150–440)
RBC: 3.15 MIL/uL — AB (ref 4.40–5.90)
RDW: 14.6 % — ABNORMAL HIGH (ref 11.5–14.5)
WBC: 8 10*3/uL (ref 3.8–10.6)

## 2015-10-06 LAB — BASIC METABOLIC PANEL
ANION GAP: 4 — AB (ref 5–15)
BUN: 18 mg/dL (ref 6–20)
CHLORIDE: 106 mmol/L (ref 101–111)
CO2: 27 mmol/L (ref 22–32)
Calcium: 7.4 mg/dL — ABNORMAL LOW (ref 8.9–10.3)
Creatinine, Ser: 1.28 mg/dL — ABNORMAL HIGH (ref 0.61–1.24)
GFR calc non Af Amer: 52 mL/min — ABNORMAL LOW (ref >60)
GFR, EST AFRICAN AMERICAN: 60 mL/min — AB (ref >60)
GLUCOSE: 102 mg/dL — AB (ref 65–99)
POTASSIUM: 3.8 mmol/L (ref 3.5–5.1)
Sodium: 137 mmol/L (ref 135–145)

## 2015-10-06 LAB — TROPONIN I
Troponin I: 0.1 ng/mL — ABNORMAL HIGH (ref <0.031)
Troponin I: 0.11 ng/mL — ABNORMAL HIGH (ref <0.031)
Troponin I: 0.11 ng/mL — ABNORMAL HIGH (ref <0.031)

## 2015-10-06 LAB — INFLUENZA PANEL BY PCR (TYPE A & B)
H1N1FLUPCR: NOT DETECTED
Influenza A By PCR: NEGATIVE
Influenza B By PCR: NEGATIVE

## 2015-10-06 LAB — MAGNESIUM: MAGNESIUM: 1.6 mg/dL — AB (ref 1.7–2.4)

## 2015-10-06 LAB — URINE CULTURE

## 2015-10-06 MED ORDER — CEFAZOLIN SODIUM-DEXTROSE 2-4 GM/100ML-% IV SOLN
2.0000 g | Freq: Three times a day (TID) | INTRAVENOUS | Status: DC
  Filled 2015-10-06 (×2): qty 100

## 2015-10-06 MED ORDER — DEXTROSE 5 % IV SOLN
1.0000 g | INTRAVENOUS | Status: DC
  Administered 2015-10-06 – 2015-10-07 (×2): 1 g via INTRAVENOUS
  Filled 2015-10-06 (×3): qty 10

## 2015-10-06 MED ORDER — MAGNESIUM SULFATE 2 GM/50ML IV SOLN
2.0000 g | Freq: Once | INTRAVENOUS | Status: AC
  Administered 2015-10-06: 2 g via INTRAVENOUS
  Filled 2015-10-06: qty 50

## 2015-10-06 NOTE — Care Management Note (Signed)
Case Management Note  Patient Details  Name: Shane Peters MRN: [id number] Date of Birth: [birth year]  Subjective/Objective:   CM assessment for discharge planning needs. Patient sitting up eating breakfast. Met with him at bedside. Patient presents from home where he lives with his wife. He is active, independent, drives and works in his [employer], environmental. No DME. No home O2.  PCP is at [facility]. Denies issues accessing medications, copays or transportation.  No discharge needs anticipated. Case closed.                Action/Plan: No needs identified.   Expected Discharge Date:                  Expected Discharge Plan:  Home/Self Care  In-House Referral:     Discharge planning Services  CM Consult  Post Acute Care Choice:    Choice offered to:     DME Arranged:    DME Agency:     HH Arranged:    [place] Agency:     Status of Service:  Completed, signed off  Medicare Important Message Given:    Date Medicare IM Given:    Medicare IM give by:    Date Additional Medicare IM Given:    Additional Medicare Important Message give by:     If discussed at [place] of Stay Meetings, dates discussed:    Additional Comments:  Jolly Mango, RN 10/06/2015, 8:40 AM

## 2015-10-06 NOTE — Progress Notes (Signed)
Pharmacy Antibiotic Note  Shane BridegroomGerald Peters is a 82 y.o. male admitted on 10/05/2015 with sepsis/UTI.  Pharmacy has been consulted for cefazolin dosing.  Patient received vancomycin and zosyn x 1 dose in ER, now on ceftriaxone 1gm IV Q24H. Blood culture BCID with staph species (no mecA detected) in 3/4 bottles. Discussed with Dr. Imogene Burnhen, will start stop ceftriaxone and start cefazolin for oxacillin sensitive staph species.  Plan: Cefazolin 2gm IV Q8H  Height: 6' (182.9 cm) Weight: 201 lb 4.8 oz (91.309 kg) IBW/kg (Calculated) : 77.6  Temp (24hrs), Avg:99.4 F (37.4 C), Min:98.1 F (36.7 C), Max:101.6 F (38.7 C)   Recent Labs Lab 10/05/15 1231 10/05/15 1553 10/06/15 0505  WBC 21.5*  --  8.0  CREATININE 1.90*  --  1.28*  LATICACIDVEN 1.3 1.2  --     Estimated Creatinine Clearance: 51.4 mL/min (by C-G formula based on Cr of 1.28).    No Known Allergies  Antimicrobials this admission: vanc/zosn x 1 dose ea 4/11 ceftriaxone 4/11>> 4/12 4/12 cefazolin >>   Microbiology results: 4/11 BCx: staph species 3/4 bottles 4/11 UCx: multiple species   Thank you for allowing pharmacy to be a part of this patient's care.  Aarit Kashuba C 10/06/2015 11:56 AM

## 2015-10-06 NOTE — Progress Notes (Addendum)
[facility] [place] [facility]   PATIENT NAME: Shane Peters    MR#:  [id number]  DATE OF BIRTH:  06/08/1934  SUBJECTIVE:  CHIEF COMPLAINT:   Chief Complaint  Patient presents with  . Fever   No complaint except generalized weakness. REVIEW OF SYSTEMS:  CONSTITUTIONAL: No fever, has generalized weakness. EYES: No blurred or double vision.  EARS, NOSE, AND THROAT: No tinnitus or ear pain.  RESPIRATORY: No cough, shortness of breath, wheezing or hemoptysis.  CARDIOVASCULAR: No chest pain, orthopnea, edema.  GASTROINTESTINAL: No nausea, vomiting, diarrhea or abdominal pain.  GENITOURINARY: No dysuria, hematuria.  ENDOCRINE: No polyuria, nocturia,  HEMATOLOGY: No anemia, easy bruising or bleeding SKIN: No rash or lesion. MUSCULOSKELETAL: No joint pain or arthritis.   NEUROLOGIC: No tingling, numbness, weakness.  PSYCHIATRY: No anxiety or depression.   DRUG ALLERGIES:  No Known Allergies  VITALS:  Blood pressure 116/58, pulse 67, temperature 98.8 F (37.1 C), temperature source Oral, resp. rate 18, height 6' (1.829 m), weight 91.309 kg (201 lb 4.8 oz), SpO2 95 %.  PHYSICAL EXAMINATION:  GENERAL:  82 y.o.-year-old patient lying in the bed with no acute distress.  EYES: Pupils equal, round, reactive to light and accommodation. No scleral icterus. Extraocular muscles intact.  HEENT: Head atraumatic, normocephalic. Oropharynx and nasopharynx clear.  NECK:  Supple, no jugular venous distention. No thyroid enlargement, no tenderness.  LUNGS: Normal breath sounds bilaterally, no wheezing, rales,rhonchi or crepitation. No use of accessory muscles of respiration.  CARDIOVASCULAR: S1, S2 normal. No murmurs, rubs, or gallops.  ABDOMEN: Soft, nontender, nondistended. Bowel sounds present. No organomegaly or mass.  EXTREMITIES: No pedal edema, cyanosis, or clubbing.  NEUROLOGIC: Cranial nerves II through XII are intact. Muscle strength 4/5 in all extremities.  Sensation intact. Gait not checked.  PSYCHIATRIC: The patient is alert and oriented x 3.  SKIN: No obvious rash, lesion, or ulcer.    LABORATORY PANEL:   CBC  Recent Labs Lab 10/06/15 0505  WBC 8.0  HGB 9.7*  HCT 27.8*  PLT 140*   ------------------------------------------------------------------------------------------------------------------  Chemistries   Recent Labs Lab 10/05/15 1231 10/06/15 0505  NA 134* 137  K 3.2* 3.8  CL 98* 106  CO2 29 27  GLUCOSE 143* 102*  BUN 22* 18  CREATININE 1.90* 1.28*  CALCIUM 8.2* 7.4*  AST 32  --   ALT 20  --   ALKPHOS 68  --   BILITOT 2.4*  --    ------------------------------------------------------------------------------------------------------------------  Cardiac Enzymes  Recent Labs Lab 10/06/15 0505  TROPONINI 0.11*   ------------------------------------------------------------------------------------------------------------------  RADIOLOGY:  Dg Chest Port 1 View  10/05/2015  CLINICAL DATA:  Sepsis EXAM: PORTABLE CHEST 1 VIEW COMPARISON:  None. FINDINGS: Borderline cardiomegaly. Status post median sternotomy. No acute infiltrate or pleural effusion. No pulmonary edema. Mild basilar atelectasis. IMPRESSION: Borderline cardiomegaly. Status post median sternotomy. Mild basilar atelectasis. Electronically Signed   By: Natasha MeadLiviu  Pop M.D.   On: 10/05/2015 12:52    EKG:   Orders placed or performed during the hospital encounter of 10/05/15  . EKG 12-Lead  . EKG 12-Lead  . EKG 12-Lead  . EKG 12-Lead    ASSESSMENT AND PLAN:   82 year old gentleman history of hypothyroidism unspecified hypertension essential. Presenting with fever.  1.Sepsis with UTI and bacteremia. He received a 30 mL/kg IV fluid bolus, vanco and zosyn in ED. discontinue ceftriaxone and start cefazolin, blood culture: Staphylococcus species,  urine culture: MULTIPLE SPECIES PRESENT, SUGGEST RECOLLECTION. ID consult. Renal US.  *  AKI due to  dehydration.  Improving with NS iv, follow up BMP.  2. Hyponatremia: improved with NS IV. 3. Hypokalemia. Improved with potassium. 4. Essential hypertension: Hold atenolol and HCTZ given relative hypotension 5. Hyperlipidemia. continue Lipitor 5. Venous thromboembolism prophylactic: Lovenox  * Elevated troponin, due to sepsis and AKI. Continue ASA and lipitor.  * CAD. As above.  Weakness. PT.   All the records are reviewed and case discussed with Care Management/Social Workerr. Management plans discussed with the patient, his wife and they are in agreement.  CODE STATUS: full code.  TOTAL TIME TAKING CARE OF THIS PATIENT: 37 minutes.  Greater than 50% time was spent on coordination of care and face-to-face counseling.  POSSIBLE D/C IN 2 DAYS, DEPENDING ON CLINICAL CONDITION.   Shaune Pollack M.D on 10/06/2015 at 11:14 AM  Between 7am to 6pm - Pager - [phone]  After 6pm go to [email or web address] - password [email or web address] [facility]  [facility]  Office  [phone]  CC: Primary care physician; Delton Prairie, MD

## 2015-10-06 NOTE — Consult Note (Signed)
[facility] Infectious Disease     Reason for Consult: Sepsis   Referring Physician: Estanislado Spire Date of Admission:  10/05/2015   Active Problems:   Sepsis ([place])   HPI: Shane Peters is a 82 y.o. male admitted with fever and fatigue following a recent episode of diarrhea x 3 days, 1 week prior, followed by several days of constipation.  He had one day of fevers, chills and temp 103.5.  In ED T 101.6,  had hypotension, wbc 21, UA with TNTC WBC.  Started on IV zosyn vanco and wbc down to 8. Cr 1.9 - > 1.2, T bili 2.4 but other lfts nml  BCX + CNS UCX Mixed. CXR negative He reports feel a little better. Had dental work one week ago but no infection, had episode "prostatitis" 6 months ago - I see UCX from [facility] 09/2013 with sensitive E coli (but resistant to ancef)- but he denies any urinary sx currently.  Since bcx + Staph species has been change to ancef today  Past Medical History  Diagnosis Date  . Hypertension   . Hyperlipidemia   . Coronary artery disease    Past Surgical History  Procedure Laterality Date  . Arterial bypass surgry    . Hernia repair     Social History  Substance Use Topics  . Smoking status: Never Smoker   . Smokeless tobacco: None  . Alcohol Use: No   Family History  Problem Relation Age of Onset  . Hypertension Other     Allergies: No Known Allergies  Current antibiotics: Antibiotics Given (last 72 hours)    Date/Time Action Medication Dose Rate   10/05/15 1738 Given   cefTRIAXone (ROCEPHIN) 1 g in dextrose 5 % 50 mL IVPB 1 g 100 mL/hr      MEDICATIONS: . aspirin EC  81 mg Oral QHS  . atorvastatin  80 mg Oral QHS  .  ceFAZolin (ANCEF) IV  2 g Intravenous 3 times per day  . enoxaparin (LOVENOX) injection  40 mg Subcutaneous Q24H  . levothyroxine  75 mcg Oral QAC breakfast  . magnesium sulfate 1 - 4 g bolus IVPB  2 g Intravenous Once  . omega-3 acid ethyl esters  1 g Oral QHS  . pantoprazole  40 mg Oral Daily    Review of Systems - 11 systems  reviewed and negative per HPI   OBJECTIVE: Temp:  [98.1 F (36.7 C)-101.6 F (38.7 C)] 98.8 F (37.1 C) (04/12 1104) Pulse Rate:  [62-85] 67 (04/12 1104) Resp:  [14-22] 18 (04/12 1104) BP: (101-116)/(50-60) 116/58 mmHg (04/12 1104) SpO2:  [95 %-100 %] 95 % (04/12 1104) Weight:  [91.309 kg (201 lb 4.8 oz)] 91.309 kg (201 lb 4.8 oz) (04/11 1527) Physical Exam  Constitutional: He is oriented to person, place, and time. He appears well-developed and well-nourished. No distress.  HENT: anicteric, perrla Mouth/Throat: Oropharynx is clear and moist. No oropharyngeal exudate.  Cardiovascular: Normal rate, regular rhythm and normal heart sounds. Sternal scar Pulmonary/Chest: Effort normal and breath sounds normal. No respiratory distress. He has no wheezes.  Abdominal: Soft. Bowel sounds are normal. He exhibits no distension. There is no tenderness.  Lymphadenopathy: He has no cervical adenopathy.  Neurological: He is alert and oriented to person, place, and time.  Skin: Skin is warm and dry. No rash noted. No erythema.  Psychiatric: He has a normal mood and affect. His behavior is normal.   LABS: Results for orders placed or performed during the hospital encounter of  10/05/15 (from the past 48 hour(s))  Lactic acid, plasma     Status: None   Collection Time: 10/05/15 12:31 PM  Result Value Ref Range   Lactic Acid, Venous 1.3 0.5 - 2.0 mmol/L  Comprehensive metabolic panel     Status: Abnormal   Collection Time: 10/05/15 12:31 PM  Result Value Ref Range   Sodium 134 (L) 135 - 145 mmol/L   Potassium 3.2 (L) 3.5 - 5.1 mmol/L   Chloride 98 (L) 101 - 111 mmol/L   CO2 29 22 - 32 mmol/L   Glucose, Bld 143 (H) 65 - 99 mg/dL   BUN 22 (H) 6 - 20 mg/dL   Creatinine, Ser 1.90 (H) 0.61 - 1.24 mg/dL   Calcium 8.2 (L) 8.9 - 10.3 mg/dL   Total Protein 6.4 (L) 6.5 - 8.1 g/dL   Albumin 3.3 (L) 3.5 - 5.0 g/dL   AST 32 15 - 41 U/L   ALT 20 17 - 63 U/L   Alkaline Phosphatase 68 38 - 126 U/L   Total  Bilirubin 2.4 (H) 0.3 - 1.2 mg/dL   GFR calc non Af Amer 32 (L) >60 mL/min   GFR calc Af Amer 37 (L) >60 mL/min    Comment: (NOTE) The eGFR has been calculated using the CKD EPI equation. This calculation has not been validated in all clinical situations. eGFR's persistently <60 mL/min signify possible Chronic Kidney Disease.    Anion gap 7 5 - 15  Lipase, blood     Status: None   Collection Time: 10/05/15 12:31 PM  Result Value Ref Range   Lipase 20 11 - 51 U/L  Troponin I     Status: Abnormal   Collection Time: 10/05/15 12:31 PM  Result Value Ref Range   Troponin I 0.09 (H) <0.031 ng/mL    Comment: READ BACK AND VERIFIED WITH NELLIE MONAR AT 1310 ON 10/05/15.Marland KitchenMarland KitchenMount Hope        PERSISTENTLY INCREASED TROPONIN VALUES IN THE RANGE OF 0.04-0.49 ng/mL CAN BE SEEN IN:       -UNSTABLE ANGINA       -CONGESTIVE HEART FAILURE       -MYOCARDITIS       -CHEST TRAUMA       -ARRYHTHMIAS       -LATE PRESENTING MYOCARDIAL INFARCTION       -COPD   CLINICAL FOLLOW-UP RECOMMENDED.   CBC WITH DIFFERENTIAL     Status: Abnormal   Collection Time: 10/05/15 12:31 PM  Result Value Ref Range   WBC 21.5 (H) 3.8 - 10.6 K/uL   RBC 3.79 (L) 4.40 - 5.90 MIL/uL   Hemoglobin 11.3 (L) 13.0 - 18.0 g/dL   HCT 33.4 (L) 40.0 - 52.0 %   MCV 87.9 80.0 - 100.0 fL   MCH 29.8 26.0 - 34.0 pg   MCHC 33.9 32.0 - 36.0 g/dL   RDW 14.4 11.5 - 14.5 %   Platelets 193 150 - 440 K/uL   Neutrophils Relative % 93 %   Neutro Abs 20.1 (H) 1.4 - 6.5 K/uL   Lymphocytes Relative 2 %   Lymphs Abs 0.4 (L) 1.0 - 3.6 K/uL   Monocytes Relative 4 %   Monocytes Absolute 0.8 0.2 - 1.0 K/uL   Eosinophils Relative 0 %   Eosinophils Absolute 0.0 0 - 0.7 K/uL   Basophils Relative 1 %   Basophils Absolute 0.2 (H) 0 - 0.1 K/uL  Blood Culture (routine x 2)     Status: Abnormal (Preliminary result)  Collection Time: 10/05/15 12:31 PM  Result Value Ref Range   Specimen Description BLOOD LEFT FATTY CASTS    Special Requests BOTTLES DRAWN  AEROBIC AND ANAEROBIC 1CCAERO,1CCANA    Culture  Setup Time      GRAM POSITIVE COCCI IN CLUSTERS IN BOTH AEROBIC AND ANAEROBIC BOTTLES CRITICAL RESULT CALLED TO, READ BACK BY AND VERIFIED WITH: KAREN HAYES 10/06/15 1140AM MLM Organism ID to follow    Culture (A)     STAPHYLOCOCCUS SPECIES IN BOTH AEROBIC AND ANAEROBIC BOTTLES   Report Status PENDING   Blood Culture (routine x 2)     Status: None (Preliminary result)   Collection Time: 10/05/15 12:31 PM  Result Value Ref Range   Specimen Description BLOOD LEFT ASSIST CONTROL    Special Requests BOTTLES DRAWN AEROBIC AND ANAEROBIC 1CCAERO,1CCANa    Culture  Setup Time      GRAM POSITIVE COCCI IN CLUSTERS ANAEROBIC BOTTLE ONLY CRITICAL RESULT CALLED TO, READ BACK BY AND VERIFIED WITH: KAREN HAYES 10/06/15 1140AM MLM    Culture GRAM POSITIVE COCCI ANAEROBIC BOTTLE ONLY    Report Status PENDING   Urinalysis complete, with microscopic ([facility] only)     Status: Abnormal   Collection Time: 10/05/15 12:31 PM  Result Value Ref Range   Color, Urine YELLOW (A) YELLOW   APPearance HAZY (A) CLEAR   Glucose, UA NEGATIVE NEGATIVE mg/dL   Bilirubin Urine NEGATIVE NEGATIVE   Ketones, ur NEGATIVE NEGATIVE mg/dL   Specific Gravity, Urine 1.013 1.005 - 1.030   Hgb urine dipstick 1+ (A) NEGATIVE   pH 6.0 5.0 - 8.0   Protein, ur NEGATIVE NEGATIVE mg/dL   Nitrite NEGATIVE NEGATIVE   Leukocytes, UA 2+ (A) NEGATIVE   RBC / HPF 0-5 0 - 5 RBC/hpf   WBC, UA TOO NUMEROUS TO COUNT 0 - 5 WBC/hpf   Bacteria, UA NONE SEEN NONE SEEN   Squamous Epithelial / LPF NONE SEEN NONE SEEN  Urine culture     Status: None   Collection Time: 10/05/15 12:31 PM  Result Value Ref Range   Specimen Description URINE, RANDOM    Special Requests NONE    Culture MULTIPLE SPECIES PRESENT, SUGGEST RECOLLECTION    Report Status 10/06/2015 FINAL   APTT     Status: None   Collection Time: 10/05/15 12:31 PM  Result Value Ref Range   aPTT 33 24 - 36 seconds  Protime-INR     Status:  Abnormal   Collection Time: 10/05/15 12:31 PM  Result Value Ref Range   Prothrombin Time 15.6 (H) 11.4 - 15.0 seconds   INR 1.22   Blood Culture ID Panel (Reflexed)     Status: Abnormal   Collection Time: 10/05/15 12:31 PM  Result Value Ref Range   Enterococcus species NOT DETECTED NOT DETECTED   Vancomycin resistance NOT DETECTED NOT DETECTED   Listeria monocytogenes NOT DETECTED NOT DETECTED   Staphylococcus species DETECTED (A) NOT DETECTED    Comment: CRITICAL RESULT CALLED TO, READ BACK BY AND VERIFIED WITH: KAREN HAYES 10/06/15 1140AM MLM    Staphylococcus aureus NOT DETECTED NOT DETECTED   Methicillin resistance NOT DETECTED NOT DETECTED   Streptococcus species NOT DETECTED NOT DETECTED   Streptococcus agalactiae NOT DETECTED NOT DETECTED   Streptococcus pneumoniae NOT DETECTED NOT DETECTED   Streptococcus pyogenes NOT DETECTED NOT DETECTED   Acinetobacter baumannii NOT DETECTED NOT DETECTED   Enterobacteriaceae species NOT DETECTED NOT DETECTED   Enterobacter cloacae complex NOT DETECTED NOT DETECTED   Escherichia coli  NOT DETECTED NOT DETECTED   Klebsiella oxytoca NOT DETECTED NOT DETECTED   Klebsiella pneumoniae NOT DETECTED NOT DETECTED   Proteus species NOT DETECTED NOT DETECTED   Serratia marcescens NOT DETECTED NOT DETECTED   Carbapenem resistance NOT DETECTED NOT DETECTED   Haemophilus influenzae NOT DETECTED NOT DETECTED   Neisseria meningitidis NOT DETECTED NOT DETECTED   Pseudomonas aeruginosa NOT DETECTED NOT DETECTED   Candida albicans NOT DETECTED NOT DETECTED   Candida glabrata NOT DETECTED NOT DETECTED   Candida krusei NOT DETECTED NOT DETECTED   Candida parapsilosis NOT DETECTED NOT DETECTED   Candida tropicalis NOT DETECTED NOT DETECTED  Lactic acid, plasma     Status: None   Collection Time: 10/05/15  3:53 PM  Result Value Ref Range   Lactic Acid, Venous 1.2 0.5 - 2.0 mmol/L  Troponin I     Status: Abnormal   Collection Time: 10/05/15 11:36 PM   Result Value Ref Range   Troponin I 0.11 (H) <0.031 ng/mL    Comment: PREVIOUS RESULT CALLED BY [facility] @ 1310 ON 10/05/2015 CAF        PERSISTENTLY INCREASED TROPONIN VALUES IN THE RANGE OF 0.04-0.49 ng/mL CAN BE SEEN IN:       -UNSTABLE ANGINA       -CONGESTIVE HEART FAILURE       -MYOCARDITIS       -CHEST TRAUMA       -ARRYHTHMIAS       -LATE PRESENTING MYOCARDIAL INFARCTION       -COPD   CLINICAL FOLLOW-UP RECOMMENDED.   Basic metabolic panel     Status: Abnormal   Collection Time: 10/06/15  5:05 AM  Result Value Ref Range   Sodium 137 135 - 145 mmol/L   Potassium 3.8 3.5 - 5.1 mmol/L   Chloride 106 101 - 111 mmol/L   CO2 27 22 - 32 mmol/L   Glucose, Bld 102 (H) 65 - 99 mg/dL   BUN 18 6 - 20 mg/dL   Creatinine, Ser 1.28 (H) 0.61 - 1.24 mg/dL   Calcium 7.4 (L) 8.9 - 10.3 mg/dL   GFR calc non Af Amer 52 (L) >60 mL/min   GFR calc Af Amer 60 (L) >60 mL/min    Comment: (NOTE) The eGFR has been calculated using the CKD EPI equation. This calculation has not been validated in all clinical situations. eGFR's persistently <60 mL/min signify possible Chronic Kidney Disease.    Anion gap 4 (L) 5 - 15  CBC     Status: Abnormal   Collection Time: 10/06/15  5:05 AM  Result Value Ref Range   WBC 8.0 3.8 - 10.6 K/uL   RBC 3.15 (L) 4.40 - 5.90 MIL/uL   Hemoglobin 9.7 (L) 13.0 - 18.0 g/dL   HCT 27.8 (L) 40.0 - 52.0 %   MCV 88.0 80.0 - 100.0 fL   MCH 30.7 26.0 - 34.0 pg   MCHC 34.9 32.0 - 36.0 g/dL   RDW 14.6 (H) 11.5 - 14.5 %   Platelets 140 (L) 150 - 440 K/uL  Troponin I     Status: Abnormal   Collection Time: 10/06/15  5:05 AM  Result Value Ref Range   Troponin I 0.11 (H) <0.031 ng/mL    Comment: PREVIOUS RESULT CALLED TO NELLIE MONAR AT 1310 ON 10/05/15.Marland KitchenMarland KitchenDalzell        PERSISTENTLY INCREASED TROPONIN VALUES IN THE RANGE OF 0.04-0.49 ng/mL CAN BE SEEN IN:       -UNSTABLE ANGINA       -  CONGESTIVE HEART FAILURE       -MYOCARDITIS       -CHEST TRAUMA       -ARRYHTHMIAS        -LATE PRESENTING MYOCARDIAL INFARCTION       -COPD   CLINICAL FOLLOW-UP RECOMMENDED.   Troponin I     Status: Abnormal   Collection Time: 10/06/15 11:15 AM  Result Value Ref Range   Troponin I 0.10 (H) <0.031 ng/mL    Comment: PREVIOUS RESULT CALLED TO NELLIE MONAR 10/05/15 1310 BY [facility]/SGD        PERSISTENTLY INCREASED TROPONIN VALUES IN THE RANGE OF 0.04-0.49 ng/mL CAN BE SEEN IN:       -UNSTABLE ANGINA       -CONGESTIVE HEART FAILURE       -MYOCARDITIS       -CHEST TRAUMA       -ARRYHTHMIAS       -LATE PRESENTING MYOCARDIAL INFARCTION       -COPD   CLINICAL FOLLOW-UP RECOMMENDED.   Magnesium     Status: Abnormal   Collection Time: 10/06/15 11:15 AM  Result Value Ref Range   Magnesium 1.6 (L) 1.7 - 2.4 mg/dL   No components found for: ESR, C REACTIVE PROTEIN MICRO: Recent Results (from the past 720 hour(s))  Blood Culture (routine x 2)     Status: Abnormal (Preliminary result)   Collection Time: 10/05/15 12:31 PM  Result Value Ref Range Status   Specimen Description BLOOD LEFT FATTY CASTS  Final   Special Requests BOTTLES DRAWN AEROBIC AND ANAEROBIC [place]  Final   Culture  Setup Time   Final    GRAM POSITIVE COCCI IN CLUSTERS IN BOTH AEROBIC AND ANAEROBIC BOTTLES CRITICAL RESULT CALLED TO, READ BACK BY AND VERIFIED WITH: KAREN HAYES 10/06/15 1140AM MLM Organism ID to follow    Culture (A)  Final    STAPHYLOCOCCUS SPECIES IN BOTH AEROBIC AND ANAEROBIC BOTTLES   Report Status PENDING  Incomplete  Blood Culture (routine x 2)     Status: None (Preliminary result)   Collection Time: 10/05/15 12:31 PM  Result Value Ref Range Status   Specimen Description BLOOD LEFT ASSIST CONTROL  Final   Special Requests BOTTLES DRAWN AEROBIC AND ANAEROBIC 1CCAERO,1CCANa  Final   Culture  Setup Time   Final    GRAM POSITIVE COCCI IN CLUSTERS ANAEROBIC BOTTLE ONLY CRITICAL RESULT CALLED TO, READ BACK BY AND VERIFIED WITH: KAREN HAYES 10/06/15 1140AM MLM    Culture GRAM POSITIVE  COCCI ANAEROBIC BOTTLE ONLY  Final   Report Status PENDING  Incomplete  Urine culture     Status: None   Collection Time: 10/05/15 12:31 PM  Result Value Ref Range Status   Specimen Description URINE, RANDOM  Final   Special Requests NONE  Final   Culture MULTIPLE SPECIES PRESENT, SUGGEST RECOLLECTION  Final   Report Status 10/06/2015 FINAL  Final  Blood Culture ID Panel (Reflexed)     Status: Abnormal   Collection Time: 10/05/15 12:31 PM  Result Value Ref Range Status   Enterococcus species NOT DETECTED NOT DETECTED Final   Vancomycin resistance NOT DETECTED NOT DETECTED Final   Listeria monocytogenes NOT DETECTED NOT DETECTED Final   Staphylococcus species DETECTED (A) NOT DETECTED Final    Comment: CRITICAL RESULT CALLED TO, READ BACK BY AND VERIFIED WITH: KAREN HAYES 10/06/15 1140AM MLM    Staphylococcus aureus NOT DETECTED NOT DETECTED Final   Methicillin resistance NOT DETECTED NOT DETECTED Final   Streptococcus species  NOT DETECTED NOT DETECTED Final   Streptococcus agalactiae NOT DETECTED NOT DETECTED Final   Streptococcus pneumoniae NOT DETECTED NOT DETECTED Final   Streptococcus pyogenes NOT DETECTED NOT DETECTED Final   Acinetobacter baumannii NOT DETECTED NOT DETECTED Final   Enterobacteriaceae species NOT DETECTED NOT DETECTED Final   Enterobacter cloacae complex NOT DETECTED NOT DETECTED Final   Escherichia coli NOT DETECTED NOT DETECTED Final   Klebsiella oxytoca NOT DETECTED NOT DETECTED Final   Klebsiella pneumoniae NOT DETECTED NOT DETECTED Final   Proteus species NOT DETECTED NOT DETECTED Final   Serratia marcescens NOT DETECTED NOT DETECTED Final   Carbapenem resistance NOT DETECTED NOT DETECTED Final   Haemophilus influenzae NOT DETECTED NOT DETECTED Final   Neisseria meningitidis NOT DETECTED NOT DETECTED Final   Pseudomonas aeruginosa NOT DETECTED NOT DETECTED Final   Candida albicans NOT DETECTED NOT DETECTED Final   Candida glabrata NOT DETECTED NOT  DETECTED Final   Candida krusei NOT DETECTED NOT DETECTED Final   Candida parapsilosis NOT DETECTED NOT DETECTED Final   Candida tropicalis NOT DETECTED NOT DETECTED Final   09/2013 Urine Culture, Comprehensive >100,000 CFU/mL Testing results predict SUSCEPTIBILITY for the oral agents cefdinir, cefuroxime, and cephalexin when used for therapy of uncomplicated UTIs due to this organism.    Specimen  Urine   Organism Antibiotic Method Susceptibility  Escherichia coli Ampicillin KB SUSCEPTIBILITY RESULT Susceptible   Cefazolin KB SUSCEPTIBILITY RESULT Intermediate   Ceftriaxone KB SUSCEPTIBILITY RESULT Susceptible   Ciprofloxacin KB SUSCEPTIBILITY RESULT Susceptible   Gentamicin KB SUSCEPTIBILITY RESULT Susceptible   Levofloxacin KB SUSCEPTIBILITY RESULT Susceptible   Nitrofurantoin KB SUSCEPTIBILITY RESULT Susceptible   Tobramycin KB SUSCEPTIBILITY RESULT Susceptible   Trimethoprim/Sulfa KB SUSCEPTIBILITY RESULT Susceptible    IMAGING: Dg Chest Port 1 View  10/05/2015  CLINICAL DATA:  Sepsis EXAM: PORTABLE CHEST 1 VIEW COMPARISON:  None. FINDINGS: Borderline cardiomegaly. Status post median sternotomy. No acute infiltrate or pleural effusion. No pulmonary edema. Mild basilar atelectasis. IMPRESSION: Borderline cardiomegaly. Status post median sternotomy. Mild basilar atelectasis. Electronically Signed   By: Lahoma Crocker M.D.   On: 10/05/2015 12:52    Assessment:   Shane Peters is a 82 y.o. male admitted with fever and fatigue following a recent episode of diarrhea x 3 days, 1 week prior, followed by several days of constipation.  He had one day of fevers, chills and temp 103.5.  In ED T 101.6,  had hypotension, wbc 21, UA with TNTC WBC.  Started on IV zosyn vanco and wbc down to 8. Cr 1.9 - > 1.2, T bili 2.4 but other lfts nml  BCX + CNS UCX Mixed. CXR negative He reports feel a little better. Had dental work one week ago but no infection, had episode "prostatitis" 6 months ago - I see  UCX from [facility] 09/2013 with sensitive E coli (but resistant to ancef)- but he denies any urinary sx currently. Since bcx + Staph species (3 bottles in 2 diff sets) has been change to ancef today  I suspect the bacteremia is contaminant as he has no lines in place, no PPM or other hardware - have asked the lab to confirm species and do sensitivities.  Would NOT tailor abx to this alone. His UA is markedly + but he has no sxs and ucx mixed. Does have hx E coli prostatitis in past.    Recommendations Check flu PCR I have repeated bcx x 1 Repeat UA and UCX Change to Ceftraixone for possible UTI  Thank you very  much for allowing me to participate in the care of this patient. Please call with questions.   Cheral Marker. Ola Spurr, MD

## 2015-10-06 NOTE — Plan of Care (Signed)
Problem: Safety: Goal: Ability to remain free from injury will improve Outcome: Progressing Bed alarm on   

## 2015-10-06 NOTE — Progress Notes (Signed)
Room air. NSR. Iso for droplet.Takes meds ok. UA was sent. Iso for droplet r/o flu. Pt reports no pain. Up to chair and tolerated it well. Pt has no further concerns at this time.

## 2015-10-06 NOTE — Progress Notes (Signed)
PT Cancellation Note  Patient Details Name: Eligha BridegroomGerald Borden MRN: [id number] DOB: 12/24/1933   Cancelled Treatment:    Reason Eval/Treat Not Completed: Patient at procedure or test/unavailable.  Pt currently off floor. Will re-attempt PT eval at a later date and time.  Lyndel SafeGarrett Candis Kabel, SPT Lyndel SafeGarrett Shacola Schussler 10/06/2015, 3:30 PM

## 2015-10-06 NOTE — Progress Notes (Signed)
Pharmacy Antibiotic Note  Shane Peters is a 82 y.o. male admitted on 10/05/2015 with UTI.  Pharmacy has been consulted for Ceftriaxone dosing.  Plan: Will order Ceftriaxone 1 gm IV q24h for treatment of UTI.   Height: 6' (182.9 cm) Weight: 201 lb 4.8 oz (91.309 kg) IBW/kg (Calculated) : 77.6  Temp (24hrs), Avg:99.4 F (37.4 C), Min:98.1 F (36.7 C), Max:101.6 F (38.7 C)   Recent Labs Lab 10/05/15 1231 10/05/15 1553 10/06/15 0505  WBC 21.5*  --  8.0  CREATININE 1.90*  --  1.28*  LATICACIDVEN 1.3 1.2  --     Estimated Creatinine Clearance: 51.4 mL/min (by C-G formula based on Cr of 1.28).    No Known Allergies  Antimicrobials this admission: Anti-infectives    Start     Dose/Rate Route Frequency Ordered Stop   10/06/15 1800  cefTRIAXone (ROCEPHIN) 1 g in dextrose 5 % 50 mL IVPB     1 g 100 mL/hr over 30 Minutes Intravenous Every 24 hours 10/06/15 1404     10/06/15 1300  ceFAZolin (ANCEF) IVPB 2g/100 mL premix  Status:  Discontinued     2 g 200 mL/hr over 30 Minutes Intravenous 3 times per day 10/06/15 1155 10/06/15 1337   10/05/15 1900  piperacillin-tazobactam (ZOSYN) IVPB 3.375 g  Status:  Discontinued     3.375 g 12.5 mL/hr over 240 Minutes Intravenous 3 times per day 10/05/15 1328 10/05/15 1426   10/05/15 1600  cefTRIAXone (ROCEPHIN) 1 g in dextrose 5 % 50 mL IVPB  Status:  Discontinued     1 g 100 mL/hr over 30 Minutes Intravenous Every 24 hours 10/05/15 1554 10/06/15 1155   10/05/15 1430  cefTRIAXone (ROCEPHIN) 1 g in dextrose 5 % 50 mL IVPB  Status:  Discontinued     1 g 100 mL/hr over 30 Minutes Intravenous Every 24 hours 10/05/15 1417 10/05/15 1554   10/05/15 1315  piperacillin-tazobactam (ZOSYN) IVPB 3.375 g     3.375 g 100 mL/hr over 30 Minutes Intravenous  Once 10/05/15 1306 10/05/15 1347   10/05/15 1315  vancomycin (VANCOCIN) IVPB 1000 mg/200 mL premix     1,000 mg 200 mL/hr over 60 Minutes Intravenous  Once 10/05/15 1306 10/05/15 1430       Microbiology results: Results for orders placed or performed during the hospital encounter of 10/05/15  Blood Culture (routine x 2)     Status: Abnormal (Preliminary result)   Collection Time: 10/05/15 12:31 PM  Result Value Ref Range Status   Specimen Description BLOOD LEFT FATTY CASTS  Final   Special Requests BOTTLES DRAWN AEROBIC AND ANAEROBIC 1CCAERO,1CCANA  Final   Culture  Setup Time   Final    GRAM POSITIVE COCCI IN CLUSTERS IN BOTH AEROBIC AND ANAEROBIC BOTTLES CRITICAL RESULT CALLED TO, READ BACK BY AND VERIFIED WITH: KAREN HAYES 10/06/15 1140AM MLM Organism ID to follow    Culture (A)  Final    STAPHYLOCOCCUS SPECIES IN BOTH AEROBIC AND ANAEROBIC BOTTLES   Report Status PENDING  Incomplete  Blood Culture (routine x 2)     Status: None (Preliminary result)   Collection Time: 10/05/15 12:31 PM  Result Value Ref Range Status   Specimen Description BLOOD LEFT ASSIST CONTROL  Final   Special Requests BOTTLES DRAWN AEROBIC AND ANAEROBIC 1CCAERO,1CCANa  Final   Culture  Setup Time   Final    GRAM POSITIVE COCCI IN CLUSTERS IN BOTH AEROBIC AND ANAEROBIC BOTTLES CRITICAL RESULT CALLED TO, READ BACK BY AND VERIFIED WITH: KAREN  HAYES 10/06/15 1140AM MLM    Culture   Final    GRAM POSITIVE COCCI IN BOTH AEROBIC AND ANAEROBIC BOTTLES   Report Status PENDING  Incomplete  Urine culture     Status: None   Collection Time: 10/05/15 12:31 PM  Result Value Ref Range Status   Specimen Description URINE, RANDOM  Final   Special Requests NONE  Final   Culture MULTIPLE SPECIES PRESENT, SUGGEST RECOLLECTION  Final   Report Status 10/06/2015 FINAL  Final  Blood Culture ID Panel (Reflexed)     Status: Abnormal   Collection Time: 10/05/15 12:31 PM  Result Value Ref Range Status   Enterococcus species NOT DETECTED NOT DETECTED Final   Vancomycin resistance NOT DETECTED NOT DETECTED Final   Listeria monocytogenes NOT DETECTED NOT DETECTED Final   Staphylococcus species DETECTED (A) NOT  DETECTED Final    Comment: CRITICAL RESULT CALLED TO, READ BACK BY AND VERIFIED WITH: KAREN HAYES 10/06/15 1140AM MLM    Staphylococcus aureus NOT DETECTED NOT DETECTED Final   Methicillin resistance NOT DETECTED NOT DETECTED Final   Streptococcus species NOT DETECTED NOT DETECTED Final   Streptococcus agalactiae NOT DETECTED NOT DETECTED Final   Streptococcus pneumoniae NOT DETECTED NOT DETECTED Final   Streptococcus pyogenes NOT DETECTED NOT DETECTED Final   Acinetobacter baumannii NOT DETECTED NOT DETECTED Final   Enterobacteriaceae species NOT DETECTED NOT DETECTED Final   Enterobacter cloacae complex NOT DETECTED NOT DETECTED Final   Escherichia coli NOT DETECTED NOT DETECTED Final   Klebsiella oxytoca NOT DETECTED NOT DETECTED Final   Klebsiella pneumoniae NOT DETECTED NOT DETECTED Final   Proteus species NOT DETECTED NOT DETECTED Final   Serratia marcescens NOT DETECTED NOT DETECTED Final   Carbapenem resistance NOT DETECTED NOT DETECTED Final   Haemophilus influenzae NOT DETECTED NOT DETECTED Final   Neisseria meningitidis NOT DETECTED NOT DETECTED Final   Pseudomonas aeruginosa NOT DETECTED NOT DETECTED Final   Candida albicans NOT DETECTED NOT DETECTED Final   Candida glabrata NOT DETECTED NOT DETECTED Final   Candida krusei NOT DETECTED NOT DETECTED Final   Candida parapsilosis NOT DETECTED NOT DETECTED Final   Candida tropicalis NOT DETECTED NOT DETECTED Final      Thank you for allowing pharmacy to be a part of this patient's care.  Dodge Ator G 10/06/2015 3:27 PM

## 2015-10-07 LAB — BASIC METABOLIC PANEL
ANION GAP: 3 — AB (ref 5–15)
BUN: 12 mg/dL (ref 6–20)
CO2: 27 mmol/L (ref 22–32)
Calcium: 7.6 mg/dL — ABNORMAL LOW (ref 8.9–10.3)
Chloride: 108 mmol/L (ref 101–111)
Creatinine, Ser: 0.84 mg/dL (ref 0.61–1.24)
Glucose, Bld: 102 mg/dL — ABNORMAL HIGH (ref 65–99)
POTASSIUM: 3.2 mmol/L — AB (ref 3.5–5.1)
SODIUM: 138 mmol/L (ref 135–145)

## 2015-10-07 LAB — MAGNESIUM: MAGNESIUM: 2 mg/dL (ref 1.7–2.4)

## 2015-10-07 MED ORDER — POTASSIUM CHLORIDE CRYS ER 20 MEQ PO TBCR
40.0000 meq | EXTENDED_RELEASE_TABLET | Freq: Once | ORAL | Status: AC
  Administered 2015-10-07: 40 meq via ORAL
  Filled 2015-10-07: qty 2

## 2015-10-07 NOTE — Plan of Care (Signed)
Problem: Pain Managment: Goal: General experience of comfort will improve Outcome: Progressing No voiced complaints of pain this shift.  Temp max at 100 this shift.

## 2015-10-07 NOTE — Progress Notes (Addendum)
[facility] [place] [facility]   PATIENT NAME: Shane BridegroomGerald Flurry    MR#:  [id number]  DATE OF BIRTH:  12/22/1933  SUBJECTIVE:  CHIEF COMPLAINT:   Chief Complaint  Patient presents with  . Fever   No complaint. REVIEW OF SYSTEMS:  CONSTITUTIONAL: No fever, has generalized weakness. EYES: No blurred or double vision.  EARS, NOSE, AND THROAT: No tinnitus or ear pain.  RESPIRATORY: No cough, shortness of breath, wheezing or hemoptysis.  CARDIOVASCULAR: No chest pain, orthopnea, edema.  GASTROINTESTINAL: No nausea, vomiting, diarrhea or abdominal pain.  GENITOURINARY: No dysuria, hematuria.  ENDOCRINE: No polyuria, nocturia,  HEMATOLOGY: No anemia, easy bruising or bleeding SKIN: No rash or lesion. MUSCULOSKELETAL: No joint pain or arthritis.   NEUROLOGIC: No tingling, numbness, weakness.  PSYCHIATRY: No anxiety or depression.   DRUG ALLERGIES:  No Known Allergies  VITALS:  Blood pressure 129/62, pulse 61, temperature 98.7 F (37.1 C), temperature source Oral, resp. rate 17, height 6' (1.829 m), weight 91.309 kg (201 lb 4.8 oz), SpO2 93 %.  PHYSICAL EXAMINATION:  GENERAL:  82 y.o.-year-old patient lying in the bed with no acute distress.  EYES: Pupils equal, round, reactive to light and accommodation. No scleral icterus. Extraocular muscles intact.  HEENT: Head atraumatic, normocephalic. Oropharynx and nasopharynx clear.  NECK:  Supple, no jugular venous distention. No thyroid enlargement, no tenderness.  LUNGS: Normal breath sounds bilaterally, no wheezing, rales,rhonchi or crepitation. No use of accessory muscles of respiration.  CARDIOVASCULAR: S1, S2 normal. No murmurs, rubs, or gallops.  ABDOMEN: Soft, nontender, nondistended. Bowel sounds present. No organomegaly or mass.  EXTREMITIES: No pedal edema, cyanosis, or clubbing.  NEUROLOGIC: Cranial nerves II through XII are intact. Muscle strength 5/5 in all extremities. Sensation intact. Gait not  checked.  PSYCHIATRIC: The patient is alert and oriented x 3.  SKIN: No obvious rash, lesion, or ulcer.    LABORATORY PANEL:   CBC  Recent Labs Lab 10/06/15 0505  WBC 8.0  HGB 9.7*  HCT 27.8*  PLT 140*   ------------------------------------------------------------------------------------------------------------------  Chemistries   Recent Labs Lab 10/05/15 1231  10/07/15 0410  NA 134*  < > 138  K 3.2*  < > 3.2*  CL 98*  < > 108  CO2 29  < > 27  GLUCOSE 143*  < > 102*  BUN 22*  < > 12  CREATININE 1.90*  < > 0.84  CALCIUM 8.2*  < > 7.6*  MG  --   < > 2.0  AST 32  --   --   ALT 20  --   --   ALKPHOS 68  --   --   BILITOT 2.4*  --   --   < > = values in this interval not displayed. ------------------------------------------------------------------------------------------------------------------  Cardiac Enzymes  Recent Labs Lab 10/06/15 1115  TROPONINI 0.10*   ------------------------------------------------------------------------------------------------------------------  RADIOLOGY:  [place] Renal  10/06/2015  CLINICAL DATA:  Acute renal failure EXAM: RENAL / URINARY TRACT ULTRASOUND COMPLETE COMPARISON:  None. FINDINGS: Right Kidney: Length: 12.1 cm. Echogenicity within normal limits. No mass or hydronephrosis visualized. Left Kidney: Length: 12.4 cm. Echogenicity within normal limits. No mass or hydronephrosis visualized. No renal calculi. Bladder: Appears normal for degree of bladder distention. Bilateral ureteral jets. IMPRESSION: No hydronephrosis or renal calculi.  Unremarkable urinary bladder. Electronically Signed   By: Natasha MeadLiviu  Pop M.D.   On: 10/06/2015 15:55   Dg Chest Port 1 View  10/05/2015  CLINICAL DATA:  Sepsis EXAM: PORTABLE CHEST  1 VIEW COMPARISON:  None. FINDINGS: Borderline cardiomegaly. Status post median sternotomy. No acute infiltrate or pleural effusion. No pulmonary edema. Mild basilar atelectasis. IMPRESSION: Borderline cardiomegaly. Status post  median sternotomy. Mild basilar atelectasis. Electronically Signed   By: Natasha Mead M.D.   On: 10/05/2015 12:52    EKG:   Orders placed or performed during the hospital encounter of 10/05/15  . EKG 12-Lead  . EKG 12-Lead  . EKG 12-Lead  . EKG 12-Lead    ASSESSMENT AND PLAN:   82 year old gentleman history of hypothyroidism unspecified hypertension essential. Presenting with fever.  1.Sepsis with UTI and bacteremia. He received a 30 mL/kg IV fluid bolus, vanco and zosyn in ED.  blood culture: Staphylococcus species. Follow up repeated U/C and B/C. Continue ceftriaxone per Dr. Sampson Goon.    * AKI due to dehydration.  Improved with NS iv.  2. Hyponatremia: improved with NS IV. 3. Hypokalemia. give potassium. 4. Essential hypertension: Hold atenolol and HCTZ given relative hypotension 5. Hyperlipidemia. continue Lipitor 5. Venous thromboembolism prophylactic: Lovenox  * Elevated troponin, due to sepsis and AKI. Continue ASA and lipitor.  * CAD. As above.  Weakness. PT.   All the records are reviewed and case discussed with Care Management/Social Workerr. Management plans discussed with the patient, his daughter and they are in agreement.  CODE STATUS: full code.  TOTAL TIME TAKING CARE OF THIS PATIENT: 33 minutes.  Greater than 50% time was spent on coordination of care and face-to-face counseling.  POSSIBLE D/C IN 1-2 DAYS, DEPENDING ON CLINICAL CONDITION.   Shaune Pollack M.D on 10/07/2015 at 10:58 AM  Between 7am to 6pm - Pager - [phone]  After 6pm go to [email or web address] - password [email or web address] [facility]  [place]  Office  [phone]  CC: Primary care physician; Delton Prairie, MD

## 2015-10-07 NOTE — Plan of Care (Signed)
Problem: Safety: Goal: Ability to remain free from injury will improve Outcome: Progressing Fall precautions in place  Problem: Pain Managment: Goal: General experience of comfort will improve Outcome: Progressing Prn medications   

## 2015-10-07 NOTE — Progress Notes (Signed)
[facility] Date of Admission:  10/05/2015     ID: Shane Peters is a 82 y.o. male with fever, CNS bacteremia, uti  Active Problems:   Sepsis (HCC)   Subjective: No fevers, feels stronger. Wants to go home  ROS  Eleven systems are reviewed and negative except per hpi  Medications:  Antibiotics Given (last 72 hours)    Date/Time Action Medication Dose Rate   10/05/15 1738 Given   cefTRIAXone (ROCEPHIN) 1 g in dextrose 5 % 50 mL IVPB 1 g 100 mL/hr   10/06/15 1800 Given   cefTRIAXone (ROCEPHIN) 1 g in dextrose 5 % 50 mL IVPB 1 g 100 mL/hr     . aspirin EC  81 mg Oral QHS  . atorvastatin  80 mg Oral QHS  . cefTRIAXone (ROCEPHIN)  IV  1 g Intravenous Q24H  . enoxaparin (LOVENOX) injection  40 mg Subcutaneous Q24H  . levothyroxine  75 mcg Oral QAC breakfast  . omega-3 acid ethyl esters  1 g Oral QHS  . pantoprazole  40 mg Oral Daily    Objective: Vital signs in last 24 hours: Temp:  [97.9 F (36.6 C)-100 F (37.8 C)] 97.9 F (36.6 C) (04/13 1141) Pulse Rate:  [56-70] 62 (04/13 1141) Resp:  [14-19] 14 (04/13 1141) BP: (126-136)/(60-75) 136/75 mmHg (04/13 1141) SpO2:  [93 %-99 %] 99 % (04/13 1141) Constitutional: He is oriented to person, place, and time. He appears well-developed and well-nourished. No distress.  HENT: anicteric, perrla Mouth/Throat: Oropharynx is clear and moist. No oropharyngeal exudate.  Cardiovascular: Normal rate, regular rhythm and normal heart sounds. Sternal scar Pulmonary/Chest: Effort normal and breath sounds normal. No respiratory distress. He has no wheezes.  Abdominal: Soft. Bowel sounds are normal. He exhibits no distension. There is no tenderness.  Lymphadenopathy: He has no cervical adenopathy.  Neurological: He is alert and oriented to person, place, and time.  Skin: Skin is warm and dry. No rash noted. No erythema.  Psychiatric: He has a normal mood and affect. His behavior is normal.   Lab  Results  Recent Labs  10/05/15 1231 10/06/15 0505 10/07/15 0410  WBC 21.5* 8.0  --   HGB 11.3* 9.7*  --   HCT 33.4* 27.8*  --   NA 134* 137 138  K 3.2* 3.8 3.2*  CL 98* 106 108  CO2 29 27 27   BUN 22* 18 12  CREATININE 1.90* 1.28* 0.84    Microbiology: Results for orders placed or performed during the hospital encounter of 10/05/15  Blood Culture (routine x 2)     Status: None (Preliminary result)   Collection Time: 10/05/15 12:31 PM  Result Value Ref Range Status   Specimen Description BLOOD LEFT FATTY CASTS  Final   Special Requests BOTTLES DRAWN AEROBIC AND ANAEROBIC 1CCAERO,1CCANA  Final   Culture  Setup Time   Final    GRAM POSITIVE COCCI IN CLUSTERS IN BOTH AEROBIC AND ANAEROBIC BOTTLES CRITICAL RESULT CALLED TO, READ BACK BY AND VERIFIED WITH: KAREN HAYES 10/06/15 1140AM MLM    Culture   Final    COAGULASE NEGATIVE STAPHYLOCOCCUS IN BOTH AEROBIC AND ANAEROBIC BOTTLES IDENTIFICATION AND SUSCEPTIBILITIES TO FOLLOW    Report Status PENDING  Incomplete  Blood Culture (routine x 2)     Status: None (Preliminary result)   Collection Time: 10/05/15 12:31 PM  Result Value Ref Range Status   Specimen Description BLOOD LEFT ASSIST CONTROL  Final   Special Requests BOTTLES DRAWN AEROBIC AND  ANAEROBIC 1CCAERO,1CCANa  Final   Culture  Setup Time   Final    GRAM POSITIVE COCCI IN CLUSTERS IN BOTH AEROBIC AND ANAEROBIC BOTTLES CRITICAL RESULT CALLED TO, READ BACK BY AND VERIFIED WITH: KAREN HAYES 10/06/15 1140AM MLM    Culture   Final    COAGULASE NEGATIVE STAPHYLOCOCCUS IN BOTH AEROBIC AND ANAEROBIC BOTTLES IDENTIFICATION AND SUSCEPTIBILITIES TO FOLLOW    Report Status PENDING  Incomplete  Urine culture     Status: None   Collection Time: 10/05/15 12:31 PM  Result Value Ref Range Status   Specimen Description URINE, RANDOM  Final   Special Requests NONE  Final   Culture MULTIPLE SPECIES PRESENT, SUGGEST RECOLLECTION  Final   Report Status 10/06/2015 FINAL  Final  Blood  Culture ID Panel (Reflexed)     Status: Abnormal   Collection Time: 10/05/15 12:31 PM  Result Value Ref Range Status   Enterococcus species NOT DETECTED NOT DETECTED Final   Vancomycin resistance NOT DETECTED NOT DETECTED Final   Listeria monocytogenes NOT DETECTED NOT DETECTED Final   Staphylococcus species DETECTED (A) NOT DETECTED Final    Comment: CRITICAL RESULT CALLED TO, READ BACK BY AND VERIFIED WITH: KAREN HAYES 10/06/15 1140AM MLM    Staphylococcus aureus NOT DETECTED NOT DETECTED Final   Methicillin resistance NOT DETECTED NOT DETECTED Final   Streptococcus species NOT DETECTED NOT DETECTED Final   Streptococcus agalactiae NOT DETECTED NOT DETECTED Final   Streptococcus pneumoniae NOT DETECTED NOT DETECTED Final   Streptococcus pyogenes NOT DETECTED NOT DETECTED Final   Acinetobacter baumannii NOT DETECTED NOT DETECTED Final   Enterobacteriaceae species NOT DETECTED NOT DETECTED Final   Enterobacter cloacae complex NOT DETECTED NOT DETECTED Final   Escherichia coli NOT DETECTED NOT DETECTED Final   Klebsiella oxytoca NOT DETECTED NOT DETECTED Final   Klebsiella pneumoniae NOT DETECTED NOT DETECTED Final   Proteus species NOT DETECTED NOT DETECTED Final   Serratia marcescens NOT DETECTED NOT DETECTED Final   Carbapenem resistance NOT DETECTED NOT DETECTED Final   Haemophilus influenzae NOT DETECTED NOT DETECTED Final   Neisseria meningitidis NOT DETECTED NOT DETECTED Final   Pseudomonas aeruginosa NOT DETECTED NOT DETECTED Final   Candida albicans NOT DETECTED NOT DETECTED Final   Candida glabrata NOT DETECTED NOT DETECTED Final   Candida krusei NOT DETECTED NOT DETECTED Final   Candida parapsilosis NOT DETECTED NOT DETECTED Final   Candida tropicalis NOT DETECTED NOT DETECTED Final  Culture, blood (single) w Reflex to PCR ID Panel     Status: None (Preliminary result)   Collection Time: 10/06/15  2:41 PM  Result Value Ref Range Status   Specimen Description BLOOD  Final    Special Requests BOTTLES DRAWN AEROBIC AND ANAEROBIC 5CCAERO,7CCANA  Final   Culture NO GROWTH < 24 HOURS  Final   Report Status PENDING  Incomplete  Urine culture     Status: None (Preliminary result)   Collection Time: 10/06/15  2:57 PM  Result Value Ref Range Status   Specimen Description URINE, CLEAN CATCH  Final   Special Requests NONE  Final   Culture NO GROWTH < 24 HOURS  Final   Report Status PENDING  Incomplete    Studies/Results: US Renal  10/06/2015  CLINICAL DATA:  Acute renal failure EXAM: RENAL / URINARY TRACT ULTRASOUND COMPLETE COMPARISON:  None. FINDINGS: Right Kidney: Length: 12.1 cm. Echogenicity within normal limits. No mass or hydronephrosis visualized. Left Kidney: Length: 12.4 cm. Echogenicity within normal limits. No mass or hydronephrosis visualized.  No renal calculi. Bladder: Appears normal for degree of bladder distention. Bilateral ureteral jets. IMPRESSION: No hydronephrosis or renal calculi.  Unremarkable urinary bladder. Electronically Signed   By: Natasha Mead M.D.   On: 10/06/2015 15:55    Assessment/Plan: Shane Peters is a 82 y.o. male admitted with fever and fatigue following a recent episode of diarrhea x 3 days, 1 week prior, followed by several days of constipation. He had one day of fevers, chills and temp 103.5. In ED T 101.6, had hypotension, wbc 21, UA with TNTC WBC. Started on IV zosyn vanco and wbc down to 8. Cr 1.9 - > 1.2, T bili 2.4 but other lfts nml BCX + CNS UCX Mixed. CXR negative He reports feel a little better. Had dental work one week ago but no infection, had episode "prostatitis" 6 months ago - I see UCX from [facility] 09/2013 with sensitive E coli (but resistant to ancef)- but he denies any urinary sx currently. Since bcx + Staph species (3 bottles in 2 diff sets) has been change to ancef today  I suspect the bacteremia is contaminant as he has no lines in place, no PPM or other hardware - have asked the lab to confirm species and do  sensitivities. Would NOT tailor abx to this alone. His UA is markedly + but he has no sxs and ucx mixed. Does have hx E coli prostatitis in past.  4/13- repeat bcx ngtd, ua with only 6-30 wbc, ucx pending. FLU neg. Renal uss neg.  Recommendations FU bcx neg,  Cont Ceftraixone for possible UTI  If he remains stable and the coag neg staph appears to be contaminant can change tomorrow to oral cipro 500 bid for total 10 days of abx since admission as likely source of sepsis was UTI but ucx was mixed.  Maybe something will grow on repeat UCX   Thank you very much for the consult. Will follow with you.  Cecila Satcher P   10/07/2015, 2:37 PM

## 2015-10-07 NOTE — Progress Notes (Signed)
Cardiac monitor discontinued per md orders 

## 2015-10-07 NOTE — Evaluation (Signed)
Physical Therapy Evaluation Patient Details Name: Shane Peters MRN: [id number] DOB: [birth year] Today's Date: 10/07/2015   History of Present Illness  Pt is a 82 y.o. male with PMH of HTN, CAD, and CABG.  Pt presented with fever, fatigue, weakness, nausea, vomitting and syncope.  Pt was admitted for sepsis.      Clinical Impression  Prior to admission pt was independent.  Pt lives with wife and pt reported that his children can assist him at home as well.  Pt was independent with bed mobility, CGA for sit to stand transfers with RW and CGA for ambulation of 200 feet with RW.  Pt reported that he does not use or own an AD at home.  PT will assess SPC and no AD during mobility tomorrow.  Due to aforementioned function and strength deficits, pt is in need of skilled physical therapy.  It is recommended that pt is discharged to home with familial support when medically appropriate.     Follow Up Recommendations No PT follow up    Equipment Recommendations  Other (comment) (TBD per further assesment )    Recommendations for Other Services       Precautions / Restrictions Precautions Precautions: Fall Restrictions Weight Bearing Restrictions: No      Mobility  Bed Mobility Overal bed mobility: Independent                Transfers Overall transfer level: Needs assistance Equipment used: Rolling walker (2 wheeled) Transfers: Sit to/from Stand Sit to Stand: Min guard            Ambulation/Gait Ambulation/Gait assistance: Min guard Ambulation Distance (Feet): 200 Feet Assistive device: Rolling walker (2 wheeled) Gait Pattern/deviations: Step-through pattern Gait velocity: decreased       Stairs            Wheelchair Mobility    Modified Rankin (Stroke Patients Only)       Balance Overall balance assessment: Needs assistance Sitting-balance support: Feet supported Sitting balance-Leahy Scale: Good     Standing balance support: Bilateral upper  extremity supported (RW ) Standing balance-Leahy Scale: Good                               Pertinent Vitals/Pain Pain Assessment: No/denies pain  See flow sheet for vitals.     Home Living Family/patient expects to be discharged to:: Private residence Living Arrangements: Spouse/significant other Available Help at Discharge: Family Type of Home: House Home Access: Stairs to enter Entrance Stairs-Rails: None Secretary/administrator of Steps: 4 Home Layout: One level Home Equipment: None      Prior Function Level of Independence: Independent               Hand Dominance        Extremity/Trunk Assessment   Upper Extremity Assessment: Overall WFL for tasks assessed           Lower Extremity Assessment: Overall WFL for tasks assessed      Cervical / Trunk Assessment: Normal  Communication   Communication: No difficulties  Cognition Arousal/Alertness: Awake/alert Behavior During Therapy: WFL for tasks assessed/performed Overall Cognitive Status: Within Functional Limits for tasks assessed                      General Comments   Nursing was contacted and cleared pt for physical therapy.  Pt was agreeable and tolerated session well.     Exercises  Assessment/Plan    PT Assessment Patient needs continued PT services  PT Diagnosis Difficulty walking   PT Problem List Decreased activity tolerance;Decreased mobility;Decreased knowledge of use of DME  PT Treatment Interventions DME instruction;Gait training;Stair training;Functional mobility training;Therapeutic activities;Therapeutic exercise;Patient/family education;Balance training   PT Goals (Current goals can be found in the Care Plan section) Acute Rehab PT Goals Patient Stated Goal: to go home  PT Goal Formulation: With patient Time For Goal Achievement: 10/21/15 Potential to Achieve Goals: Good    Frequency Min 2X/week   Barriers to discharge        Co-evaluation                End of Session Equipment Utilized During Treatment: Gait belt Activity Tolerance: Patient tolerated treatment well Patient left: in chair;with call bell/phone within reach;with chair alarm set;with family/visitor present Nurse Communication: Mobility status;Precautions         Time: [id number] PT Time Calculation (min) (ACUTE ONLY): 23 min   Charges:         PT G Codes:       Shane Peters, SPT  Shane Peters 10/07/2015, 12:31 PM

## 2015-10-07 NOTE — Care Management Important Message (Signed)
Important Message  Patient Details  Name: Shane Peters MRN: [id number] Date of Birth: 06/17/1934   Medicare Important Message Given:  Yes    Olegario MessierKathy A Eleen Litz 10/07/2015, 12:59 PM

## 2015-10-08 LAB — BASIC METABOLIC PANEL
ANION GAP: 2 — AB (ref 5–15)
BUN: 9 mg/dL (ref 6–20)
CHLORIDE: 108 mmol/L (ref 101–111)
CO2: 27 mmol/L (ref 22–32)
Calcium: 7.7 mg/dL — ABNORMAL LOW (ref 8.9–10.3)
Creatinine, Ser: 0.77 mg/dL (ref 0.61–1.24)
GFR calc Af Amer: >60 mL/min (ref >60)
Glucose, Bld: 102 mg/dL — ABNORMAL HIGH (ref 65–99)
POTASSIUM: 3.5 mmol/L (ref 3.5–5.1)
SODIUM: 137 mmol/L (ref 135–145)

## 2015-10-08 LAB — URINE CULTURE: CULTURE: NO GROWTH

## 2015-10-08 MED ORDER — CIPROFLOXACIN HCL 500 MG PO TABS: 500.0000 mg | ORAL_TABLET | Freq: Two times a day (BID) | ORAL | Status: DC

## 2015-10-08 NOTE — Discharge Instructions (Signed)
Heart healthy diet. °Activity as tolerated. °

## 2015-10-08 NOTE — Discharge Summary (Signed)
[facility] [place] at [facility]   PATIENT NAME: Shane Peters    MR#:  [id number]  DATE OF BIRTH:  Aug 05, 1933  DATE OF ADMISSION:  10/05/2015 ADMITTING PHYSICIAN: Wyatt Haste, MD  DATE OF DISCHARGE: 10/08/2015 11:05 AM  PRIMARY CARE PHYSICIAN: Delton Prairie, MD    ADMISSION DIAGNOSIS:  Dehydration [E86.0] Hyperbilirubinemia [E80.6] Elevated troponin I level [R79.89] Acute kidney injury (HCC) [N17.9] Sepsis, due to unspecified organism (HCC) [A41.9] Hypotension, unspecified hypotension type [I95.9] Urinary tract infection without hematuria, site unspecified [N39.0]   DISCHARGE DIAGNOSIS:  Sepsis with UTI and bacteremia AKI due to dehydration SECONDARY DIAGNOSIS:   Past Medical History  Diagnosis Date  . Hypertension   . Hyperlipidemia   . Coronary artery disease     HOSPITAL COURSE:   82 year old gentleman history of hypothyroidism unspecified hypertension essential. Presenting with fever.  1.Sepsis with UTI and bacteremia. He received a 30 mL/kg IV fluid bolus, vanco and zosyn in ED. blood culture: Staphylococcus species. Negative repeated U/C and B/C. He has been treated with ceftriaxone. Change to po cipro bid for total 10 days per Dr. Sampson Goon.    * AKI due to dehydration. Improved with NS iv.  2. Hyponatremia: improved with NS IV. 3. Hypokalemia. Improved with potassium. 4. Essential hypertension: Hold atenolol and HCTZ given relative hypotension. BP is elevated, resume. 5. Hyperlipidemia. continue Lipitor 5. Venous thromboembolism prophylactic: Lovenox  * Elevated troponin, due to sepsis and AKI. Continue ASA and lipitor.  * CAD. As above.  DISCHARGE CONDITIONS:   Stable, discharged to home today.  CONSULTS OBTAINED:  Treatment Team:  Wyatt Haste, MD  DRUG ALLERGIES:  No Known Allergies  DISCHARGE MEDICATIONS:   Discharge Medication List as of 10/08/2015 10:19 AM    START taking these medications   Details   ciprofloxacin (CIPRO) 500 MG tablet Take 1 tablet (500 mg total) by mouth 2 (two) times daily., Starting 10/08/2015, Until Discontinued, Print      CONTINUE these medications which have NOT CHANGED   Details  aspirin EC 81 MG tablet Take 81 mg by mouth at bedtime., Until Discontinued, Historical Med    atenolol (TENORMIN) 100 MG tablet Take 100 mg by mouth daily., Until Discontinued, Historical Med    atorvastatin (LIPITOR) 80 MG tablet Take 80 mg by mouth at bedtime. , Until Discontinued, Historical Med    Cinnamon 500 MG capsule Take 500 mg by mouth at bedtime., Until Discontinued, Historical Med    esomeprazole (NEXIUM) 20 MG capsule Take 20 mg by mouth daily before breakfast., Until Discontinued, Historical Med    hydrochlorothiazide (MICROZIDE) 12.5 MG capsule Take 12.5 mg by mouth daily., Until Discontinued, Historical Med    levothyroxine (SYNTHROID, LEVOTHROID) 75 MCG tablet Take 75 mcg by mouth daily before breakfast., Until Discontinued, Historical Med    omega-3 acid ethyl esters (LOVAZA) 1 g capsule Take 1 g by mouth at bedtime., Until Discontinued, Historical Med         DISCHARGE INSTRUCTIONS:    If you experience worsening of your admission symptoms, develop shortness of breath, life threatening emergency, suicidal or homicidal thoughts you must seek medical attention immediately by calling 911 or calling your MD immediately  if symptoms less severe.  You Must read complete instructions/literature along with all the possible adverse reactions/side effects for all the Medicines you take and that have been prescribed to you. Take any new Medicines after you have completely understood and accept all the possible adverse reactions/side effects.  Please note  You were cared for by a hospitalist during your hospital stay. If you have any questions about your discharge medications or the care you received while you were in the hospital after you are discharged, you can  call the unit and asked to speak with the hospitalist on call if the hospitalist that took care of you is not available. Once you are discharged, your primary care physician will handle any further medical issues. Please note that NO REFILLS for any discharge medications will be authorized once you are discharged, as it is imperative that you return to your primary care physician (or establish a relationship with a primary care physician if you do not have one) for your aftercare needs so that they can reassess your need for medications and monitor your lab values.    Today   SUBJECTIVE    No complaint.  VITAL SIGNS:  Blood pressure 141/70, pulse 63, temperature 98.8 F (37.1 C), temperature source Oral, resp. rate 20, height 6' (1.829 m), weight 91.309 kg (201 lb 4.8 oz), SpO2 95 %.  I/O:   Intake/Output Summary (Last 24 hours) at 10/08/15 1621 Last data filed at 10/08/15 0854  Gross per 24 hour  Intake   2210 ml  Output   1200 ml  Net   1010 ml    PHYSICAL EXAMINATION:  GENERAL:  82 y.o.-year-old patient lying in the bed with no acute distress.  EYES: Pupils equal, round, reactive to light and accommodation. No scleral icterus. Extraocular muscles intact.  HEENT: Head atraumatic, normocephalic. Oropharynx and nasopharynx clear.  NECK:  Supple, no jugular venous distention. No thyroid enlargement, no tenderness.  LUNGS: Normal breath sounds bilaterally, no wheezing, rales,rhonchi or crepitation. No use of accessory muscles of respiration.  CARDIOVASCULAR: S1, S2 normal. No murmurs, rubs, or gallops.  ABDOMEN: Soft, non-tender, non-distended. Bowel sounds present. No organomegaly or mass.  EXTREMITIES: No pedal edema, cyanosis, or clubbing.  NEUROLOGIC: Cranial nerves II through XII are intact. Muscle strength 5/5 in all extremities. Sensation intact. Gait not checked.  PSYCHIATRIC: The patient is alert and oriented x 3.  SKIN: No obvious rash, lesion, or ulcer.   DATA REVIEW:    CBC  Recent Labs Lab 10/06/15 0505  WBC 8.0  HGB 9.7*  HCT 27.8*  PLT 140*    Chemistries   Recent Labs Lab 10/05/15 1231  10/07/15 0410 10/08/15 0402  NA 134*  < > 138 137  K 3.2*  < > 3.2* 3.5  CL 98*  < > 108 108  CO2 29  < > 27 27  GLUCOSE 143*  < > 102* 102*  BUN 22*  < > 12 9  CREATININE 1.90*  < > 0.84 0.77  CALCIUM 8.2*  < > 7.6* 7.7*  MG  --   < > 2.0  --   AST 32  --   --   --   ALT 20  --   --   --   ALKPHOS 68  --   --   --   BILITOT 2.4*  --   --   --   < > = values in this interval not displayed.  Cardiac Enzymes  Recent Labs Lab 10/06/15 1115  TROPONINI 0.10*    Microbiology Results  Results for orders placed or performed during the hospital encounter of 10/05/15  Blood Culture (routine x 2)     Status: Abnormal (Preliminary result)   Collection Time: 10/05/15 12:31 PM  Result Value Ref  Range Status   Specimen Description BLOOD LEFT FATTY CASTS  Final   Special Requests BOTTLES DRAWN AEROBIC AND ANAEROBIC 1CCAERO,1CCANA  Final   Culture  Setup Time   Final    GRAM POSITIVE COCCI IN CLUSTERS IN BOTH AEROBIC AND ANAEROBIC BOTTLES CRITICAL RESULT CALLED TO, READ BACK BY AND VERIFIED WITH: KAREN HAYES 10/06/15 1140AM MLM    Culture (A)  Final    STAPHYLOCOCCUS EPIDERMIDIS IN BOTH AEROBIC AND ANAEROBIC BOTTLES    Report Status PENDING  Incomplete   Organism ID, Bacteria STAPHYLOCOCCUS EPIDERMIDIS  Final      Susceptibility   Staphylococcus epidermidis - MIC*    CIPROFLOXACIN <=0.5 SENSITIVE Sensitive     ERYTHROMYCIN <=0.25 SENSITIVE Sensitive     GENTAMICIN <=0.5 SENSITIVE Sensitive     OXACILLIN Value in next row Sensitive      <=0.25 SENSITIVEWARNING: For oxacillin-resistant S.aureus and coagulase-negative staphylococci (MRS), other beta-lactam agents, ie, penicillins, beta-lactam/beta-lactamase inhibitor combinations, cephems (with the exception of the cephalosporins with anti-MRSA activity), and carbapenems, may appear active in vitro,  but are not effective clinically.  --CLSI, Vol.32 No.3, January 2012, pg 70.    TETRACYCLINE Value in next row Sensitive      <=0.25 SENSITIVEWARNING: For oxacillin-resistant S.aureus and coagulase-negative staphylococci (MRS), other beta-lactam agents, ie, penicillins, beta-lactam/beta-lactamase inhibitor combinations, cephems (with the exception of the cephalosporins with anti-MRSA activity), and carbapenems, may appear active in vitro, but are not effective clinically.  --CLSI, Vol.32 No.3, January 2012, pg 70.    VANCOMYCIN Value in next row Sensitive      <=0.25 SENSITIVEWARNING: For oxacillin-resistant S.aureus and coagulase-negative staphylococci (MRS), other beta-lactam agents, ie, penicillins, beta-lactam/beta-lactamase inhibitor combinations, cephems (with the exception of the cephalosporins with anti-MRSA activity), and carbapenems, may appear active in vitro, but are not effective clinically.  --CLSI, Vol.32 No.3, January 2012, pg 70.    CLINDAMYCIN Value in next row Sensitive      <=0.25 SENSITIVEWARNING: For oxacillin-resistant S.aureus and coagulase-negative staphylococci (MRS), other beta-lactam agents, ie, penicillins, beta-lactam/beta-lactamase inhibitor combinations, cephems (with the exception of the cephalosporins with anti-MRSA activity), and carbapenems, may appear active in vitro, but are not effective clinically.  --CLSI, Vol.32 No.3, January 2012, pg 70.    RIFAMPIN Value in next row Sensitive      <=0.25 SENSITIVEWARNING: For oxacillin-resistant S.aureus and coagulase-negative staphylococci (MRS), other beta-lactam agents, ie, penicillins, beta-lactam/beta-lactamase inhibitor combinations, cephems (with the exception of the cephalosporins with anti-MRSA activity), and carbapenems, may appear active in vitro, but are not effective clinically.  --CLSI, Vol.32 No.3, January 2012, pg 70.    * STAPHYLOCOCCUS EPIDERMIDIS  Blood Culture (routine x 2)     Status: Abnormal (Preliminary  result)   Collection Time: 10/05/15 12:31 PM  Result Value Ref Range Status   Specimen Description BLOOD LEFT ASSIST CONTROL  Final   Special Requests BOTTLES DRAWN AEROBIC AND ANAEROBIC 1CCAERO,1CCANa  Final   Culture  Setup Time   Final    GRAM POSITIVE COCCI IN CLUSTERS IN BOTH AEROBIC AND ANAEROBIC BOTTLES CRITICAL RESULT CALLED TO, READ BACK BY AND VERIFIED WITH: KAREN HAYES 10/06/15 1140AM MLM    Culture (A)  Final    STAPHYLOCOCCUS EPIDERMIDIS IN BOTH AEROBIC AND ANAEROBIC BOTTLES    Report Status PENDING  Incomplete   Organism ID, Bacteria STAPHYLOCOCCUS EPIDERMIDIS  Final      Susceptibility   Staphylococcus epidermidis - MIC*    CIPROFLOXACIN <=0.5 SENSITIVE Sensitive     ERYTHROMYCIN <=0.25 SENSITIVE Sensitive     GENTAMICIN <=  0.5 SENSITIVE Sensitive     OXACILLIN Value in next row Sensitive      <=0.25 SENSITIVEWARNING: For oxacillin-resistant S.aureus and coagulase-negative staphylococci (MRS), other beta-lactam agents, ie, penicillins, beta-lactam/beta-lactamase inhibitor combinations, cephems (with the exception of the cephalosporins with anti-MRSA activity), and carbapenems, may appear active in vitro, but are not effective clinically.  --CLSI, Vol.32 No.3, January 2012, pg 70.    TETRACYCLINE Value in next row Sensitive      <=0.25 SENSITIVEWARNING: For oxacillin-resistant S.aureus and coagulase-negative staphylococci (MRS), other beta-lactam agents, ie, penicillins, beta-lactam/beta-lactamase inhibitor combinations, cephems (with the exception of the cephalosporins with anti-MRSA activity), and carbapenems, may appear active in vitro, but are not effective clinically.  --CLSI, Vol.32 No.3, January 2012, pg 70.    VANCOMYCIN Value in next row Sensitive      <=0.25 SENSITIVEWARNING: For oxacillin-resistant S.aureus and coagulase-negative staphylococci (MRS), other beta-lactam agents, ie, penicillins, beta-lactam/beta-lactamase inhibitor combinations, cephems (with the  exception of the cephalosporins with anti-MRSA activity), and carbapenems, may appear active in vitro, but are not effective clinically.  --CLSI, Vol.32 No.3, January 2012, pg 70.    CLINDAMYCIN Value in next row Sensitive      <=0.25 SENSITIVEWARNING: For oxacillin-resistant S.aureus and coagulase-negative staphylococci (MRS), other beta-lactam agents, ie, penicillins, beta-lactam/beta-lactamase inhibitor combinations, cephems (with the exception of the cephalosporins with anti-MRSA activity), and carbapenems, may appear active in vitro, but are not effective clinically.  --CLSI, Vol.32 No.3, January 2012, pg 70.    RIFAMPIN Value in next row Sensitive      <=0.25 SENSITIVEWARNING: For oxacillin-resistant S.aureus and coagulase-negative staphylococci (MRS), other beta-lactam agents, ie, penicillins, beta-lactam/beta-lactamase inhibitor combinations, cephems (with the exception of the cephalosporins with anti-MRSA activity), and carbapenems, may appear active in vitro, but are not effective clinically.  --CLSI, Vol.32 No.3, January 2012, pg 70.    * STAPHYLOCOCCUS EPIDERMIDIS  Urine culture     Status: None   Collection Time: 10/05/15 12:31 PM  Result Value Ref Range Status   Specimen Description URINE, RANDOM  Final   Special Requests NONE  Final   Culture MULTIPLE SPECIES PRESENT, SUGGEST RECOLLECTION  Final   Report Status 10/06/2015 FINAL  Final  Blood Culture ID Panel (Reflexed)     Status: Abnormal   Collection Time: 10/05/15 12:31 PM  Result Value Ref Range Status   Enterococcus species NOT DETECTED NOT DETECTED Final   Vancomycin resistance NOT DETECTED NOT DETECTED Final   Listeria monocytogenes NOT DETECTED NOT DETECTED Final   Staphylococcus species DETECTED (A) NOT DETECTED Final    Comment: CRITICAL RESULT CALLED TO, READ BACK BY AND VERIFIED WITH: KAREN HAYES 10/06/15 1140AM MLM    Staphylococcus aureus NOT DETECTED NOT DETECTED Final   Methicillin resistance NOT DETECTED NOT  DETECTED Final   Streptococcus species NOT DETECTED NOT DETECTED Final   Streptococcus agalactiae NOT DETECTED NOT DETECTED Final   Streptococcus pneumoniae NOT DETECTED NOT DETECTED Final   Streptococcus pyogenes NOT DETECTED NOT DETECTED Final   Acinetobacter baumannii NOT DETECTED NOT DETECTED Final   Enterobacteriaceae species NOT DETECTED NOT DETECTED Final   Enterobacter cloacae complex NOT DETECTED NOT DETECTED Final   Escherichia coli NOT DETECTED NOT DETECTED Final   Klebsiella oxytoca NOT DETECTED NOT DETECTED Final   Klebsiella pneumoniae NOT DETECTED NOT DETECTED Final   Proteus species NOT DETECTED NOT DETECTED Final   Serratia marcescens NOT DETECTED NOT DETECTED Final   Carbapenem resistance NOT DETECTED NOT DETECTED Final   Haemophilus influenzae NOT DETECTED NOT DETECTED Final  Neisseria meningitidis NOT DETECTED NOT DETECTED Final   Pseudomonas aeruginosa NOT DETECTED NOT DETECTED Final   Candida albicans NOT DETECTED NOT DETECTED Final   Candida glabrata NOT DETECTED NOT DETECTED Final   Candida krusei NOT DETECTED NOT DETECTED Final   Candida parapsilosis NOT DETECTED NOT DETECTED Final   Candida tropicalis NOT DETECTED NOT DETECTED Final  Culture, blood (single) w Reflex to PCR ID Panel     Status: None (Preliminary result)   Collection Time: 10/06/15  2:41 PM  Result Value Ref Range Status   Specimen Description BLOOD  Final   Special Requests BOTTLES DRAWN AEROBIC AND ANAEROBIC 5CCAERO,7CCANA  Final   Culture NO GROWTH < 24 HOURS  Final   Report Status PENDING  Incomplete  Urine culture     Status: None   Collection Time: 10/06/15  2:57 PM  Result Value Ref Range Status   Specimen Description URINE, CLEAN CATCH  Final   Special Requests NONE  Final   Culture NO GROWTH 2 DAYS  Final   Report Status 10/08/2015 FINAL  Final    RADIOLOGY:  No results found.      Management plans discussed with the patient, his daughter and they are in  agreement.  CODE STATUS:  Code Status History    Date Active Date Inactive Code Status Order ID Comments User Context   10/05/2015  2:17 PM 10/08/2015  2:05 PM Full Code [id number]  Wyatt Haste, MD ED      TOTAL TIME TAKING CARE OF THIS PATIENT: 32 minutes.    Shaune Pollack M.D on 10/08/2015 at 4:21 PM  Between 7am to 6pm - Pager - [phone]  After 6pm go to [email or web address] - password [email or web address] [facility]  [facility]  Office  [phone]  CC: Primary care physician; Delton Prairie, MD

## 2015-10-08 NOTE — Progress Notes (Signed)
Physical Therapy Treatment Patient Details Name: Shane Peters MRN: [id number] DOB: 12-Dec-1933 Today's Date: 10/08/2015    History of Present Illness Pt is a 82 y.o. male with PMH of HTN, CAD, and CABG.  Pt presented with fever, fatigue, weakness, nausea, vomitting and syncope.  Pt was admitted for sepsis.      PT Comments    Pt was independent with bed mobility and independent with sit to stand.  Pt ambulated 70 feet with SPC and 70 feet with no AD.  Pt was supervision with ambulation.  Pt appeared to have greater stability ambulating with no AD compared to [facility].  Pt reported that he felt that ambulating without AD was easier compared to [facility].  Next session PT will attempt high level balance and increased ambulation distance.     Follow Up Recommendations  No PT follow up     Equipment Recommendations  None recommended by PT    Recommendations for Other Services       Precautions / Restrictions Precautions Precautions: Fall Restrictions Weight Bearing Restrictions: No    Mobility  Bed Mobility Overal bed mobility: Independent                Transfers Overall transfer level: Independent   Transfers: Sit to/from Stand              Ambulation/Gait Ambulation/Gait assistance: Supervision Ambulation Distance (Feet): 140 Feet Assistive device: Straight cane;None (70 feet with SPC, 70 feet with no AD) Gait Pattern/deviations: Step-through pattern Gait velocity: decreased        Stairs            Wheelchair Mobility    Modified Rankin (Stroke Patients Only)       Balance Overall balance assessment: Independent Sitting-balance support: Feet supported Sitting balance-Leahy Scale: Normal     Standing balance support: No upper extremity supported;Single extremity supported (SPC ) Standing balance-Leahy Scale: Normal                      Cognition Arousal/Alertness: Awake/alert Behavior During Therapy: WFL for tasks  assessed/performed Overall Cognitive Status: Within Functional Limits for tasks assessed                      Exercises      General Comments   Nursing was contacted and cleared pt for physical therapy.  Pt was agreeable and tolerated session well.  Pt's daughter was present during entire session.         Pertinent Vitals/Pain Pain Assessment: No/denies pain  See flow sheet for vitals.     Home Living                      Prior Function            PT Goals (current goals can now be found in the care plan section) Acute Rehab PT Goals Patient Stated Goal: to go home  PT Goal Formulation: With patient Time For Goal Achievement: 10/21/15 Potential to Achieve Goals: Good Progress towards PT goals: Progressing toward goals    Frequency  Min 2X/week    PT Plan      Co-evaluation             End of Session Equipment Utilized During Treatment: Gait belt Activity Tolerance: Patient tolerated treatment well Patient left: in bed;with bed alarm set;with call bell/phone within reach;with family/visitor present     Time: 4098-1191 PT Time Calculation (min) (ACUTE  ONLY): 9 min  Charges:                       G Codes:      Lyndel SafeGarrett Zaylen Susman, SPT  Lyndel SafeGarrett Donnamae [place] 10/08/2015, 11:16 AM

## 2015-10-09 LAB — CULTURE, BLOOD (ROUTINE X 2)

## 2015-10-11 LAB — CULTURE, BLOOD (SINGLE): Culture: NO GROWTH

## 2016-01-18 ENCOUNTER — Other Ambulatory Visit
Admission: RE | Admit: 2016-01-18 | Discharge: 2016-01-18 | Disposition: A | Discharge status: Home / Self Care | Payer: Medicare Other | Source: Ambulatory Visit | Attending: Family Medicine | Admitting: Family Medicine

## 2016-01-18 DIAGNOSIS — A4189 Other specified sepsis: Secondary | ICD-10-CM | POA: Diagnosis present

## 2016-01-18 DIAGNOSIS — N390 Urinary tract infection, site not specified: Secondary | ICD-10-CM | POA: Insufficient documentation

## 2016-01-18 LAB — CREATININE, SERUM
Creatinine, Ser: 0.82 mg/dL (ref 0.61–1.24)
GFR calc non Af Amer: >60 mL/min (ref >60)

## 2016-01-18 LAB — CBC WITH DIFFERENTIAL/PLATELET
Basophils Absolute: 0 10*3/uL (ref 0–0.1)
Basophils Relative: 1 %
EOS ABS: 0.1 10*3/uL (ref 0–0.7)
EOS PCT: 2 %
HCT: 35.9 % — ABNORMAL LOW (ref 40.0–52.0)
Hemoglobin: 12.6 g/dL — ABNORMAL LOW (ref 13.0–18.0)
LYMPHS ABS: 1.8 10*3/uL (ref 1.0–3.6)
Lymphocytes Relative: 26 %
MCH: 31 pg (ref 26.0–34.0)
MCHC: 35 g/dL (ref 32.0–36.0)
MCV: 88.5 fL (ref 80.0–100.0)
Monocytes Absolute: 0.5 10*3/uL (ref 0.2–1.0)
Monocytes Relative: 7 %
Neutro Abs: 4.6 10*3/uL (ref 1.4–6.5)
Neutrophils Relative %: 64 %
PLATELETS: 322 10*3/uL (ref 150–440)
RBC: 4.06 MIL/uL — AB (ref 4.40–5.90)
RDW: 14.8 % — AB (ref 11.5–14.5)
WBC: 7.1 10*3/uL (ref 3.8–10.6)

## 2016-01-18 LAB — ALT: ALT: 38 U/L (ref 17–63)

## 2016-01-18 LAB — AST: AST: 27 U/L (ref 15–41)

## 2016-01-18 LAB — BUN: BUN: 12 mg/dL (ref 6–20)

## 2016-08-02 IMAGING — DX DG CHEST 1V PORT
1 series · 1 of 1 positions shown · non-contrast
Comparison: None.

CLINICAL DATA: Sepsis

EXAM:
PORTABLE CHEST 1 VIEW

[chest ap]
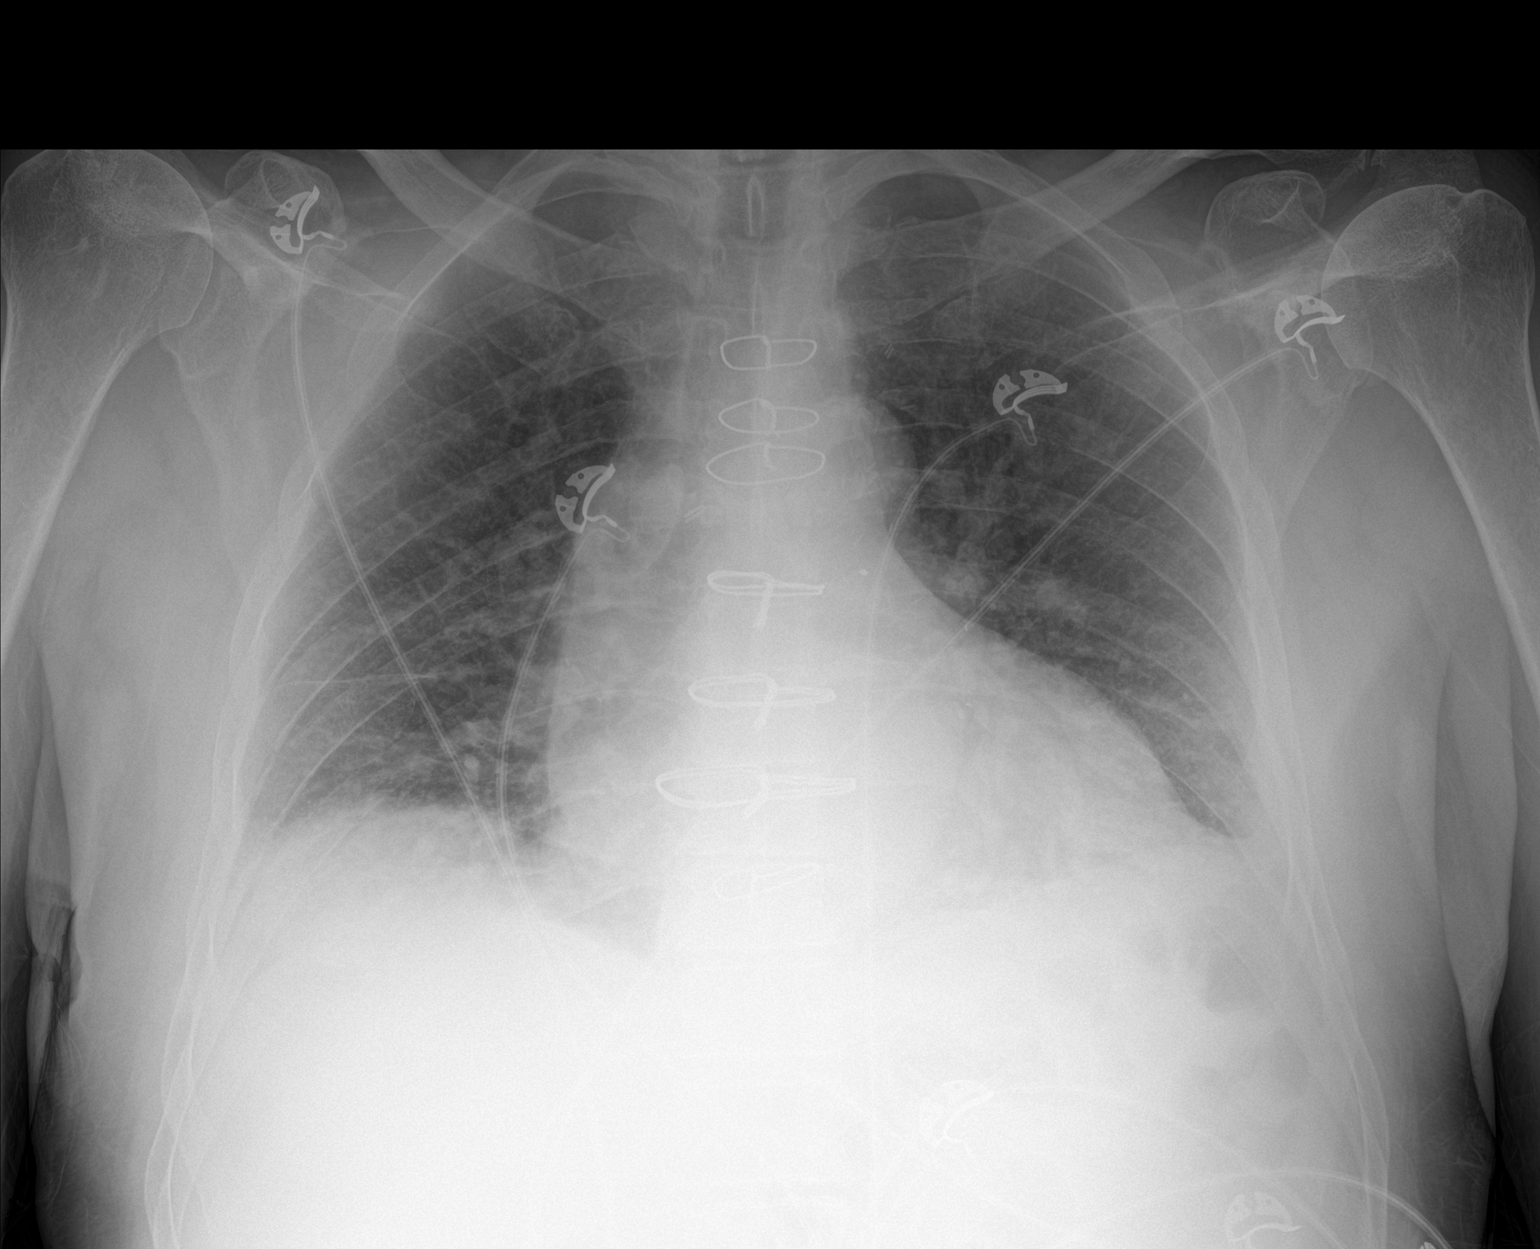

[1 of 1 positions shown; findings below may reference images not displayed]

FINDINGS: Borderline cardiomegaly. Status post median sternotomy. No acute
infiltrate or pleural effusion. No pulmonary edema. Mild basilar
atelectasis.
IMPRESSION: Borderline cardiomegaly. Status post median sternotomy. Mild basilar
atelectasis.

## 2016-09-23 IMAGING — US US RENAL
1 series · 14 of 25 positions shown · non-contrast
Comparison: None.

CLINICAL DATA: Acute renal failure

EXAM:
RENAL / URINARY TRACT ULTRASOUND COMPLETE

[Series 1: us renal · 0.25mm/px · 14 of 49 slices shown]
[im 1/49]
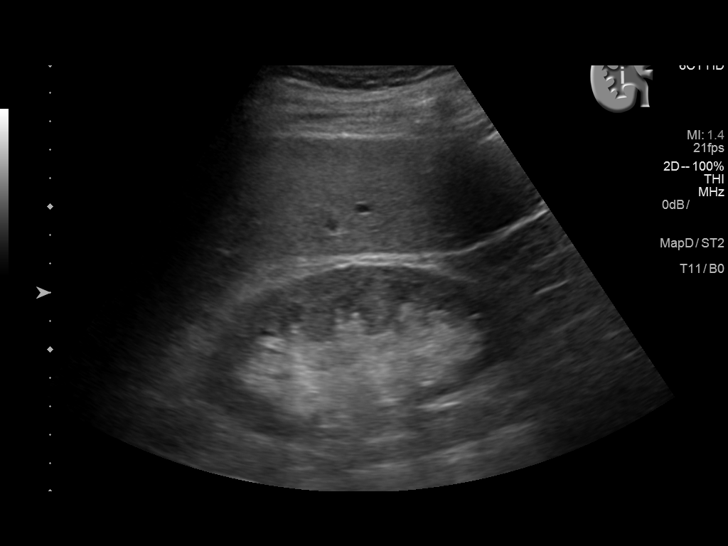
[im 5/49]
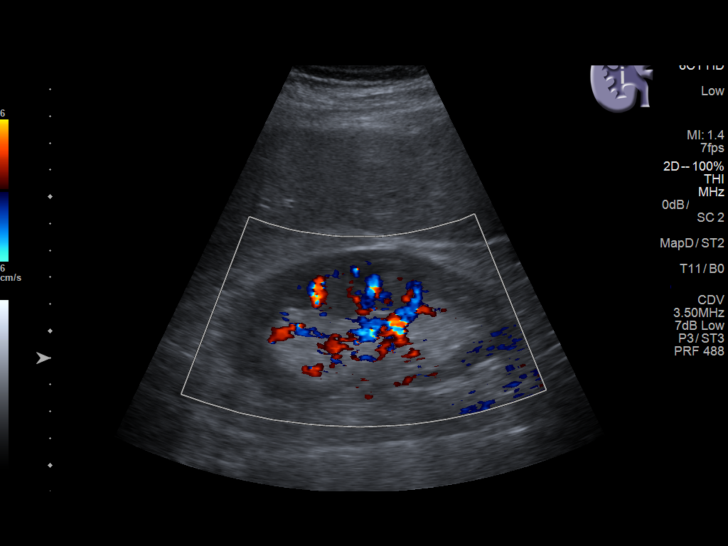
[im 9/49]
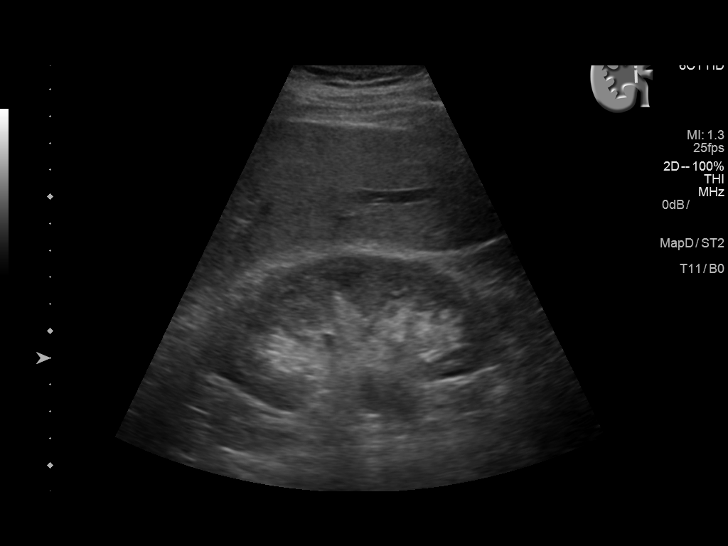
[im 13/49]
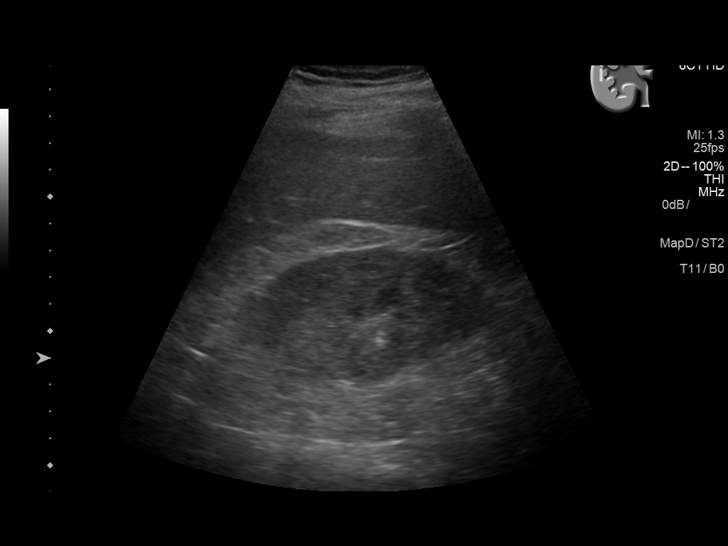
[im 17/49]
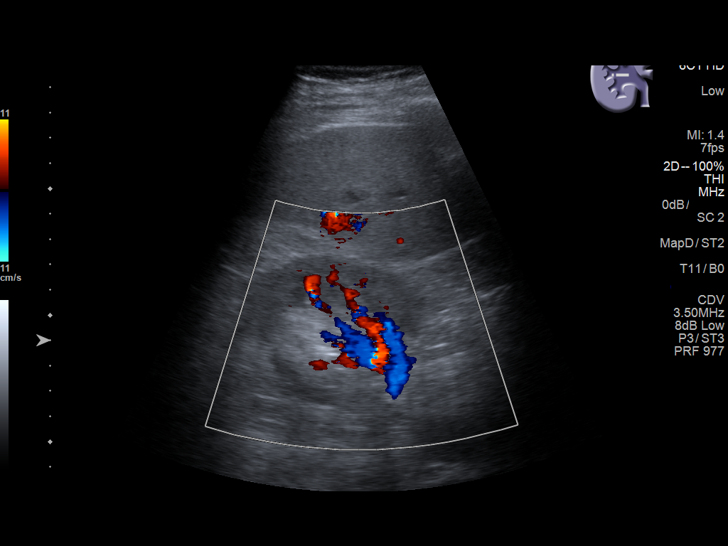
[im 19/49]
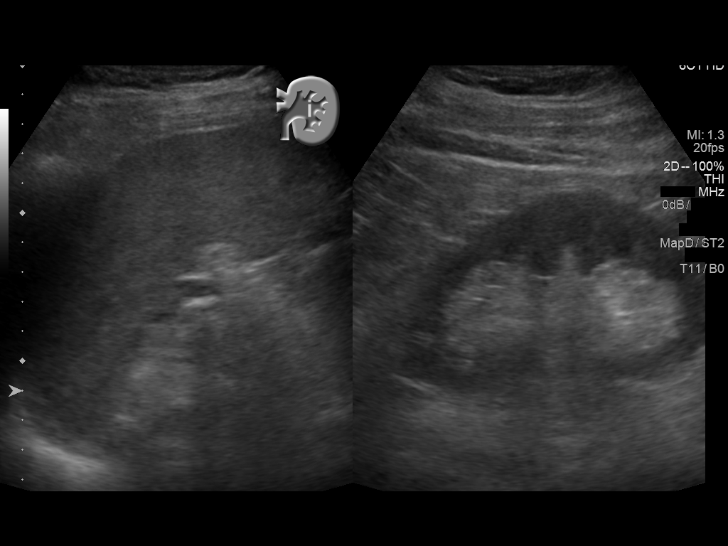
[im 23/49]
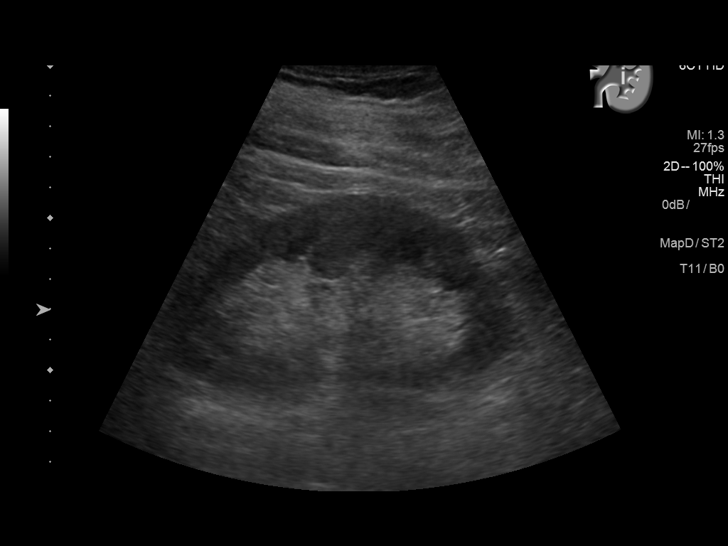
[im 27/49]
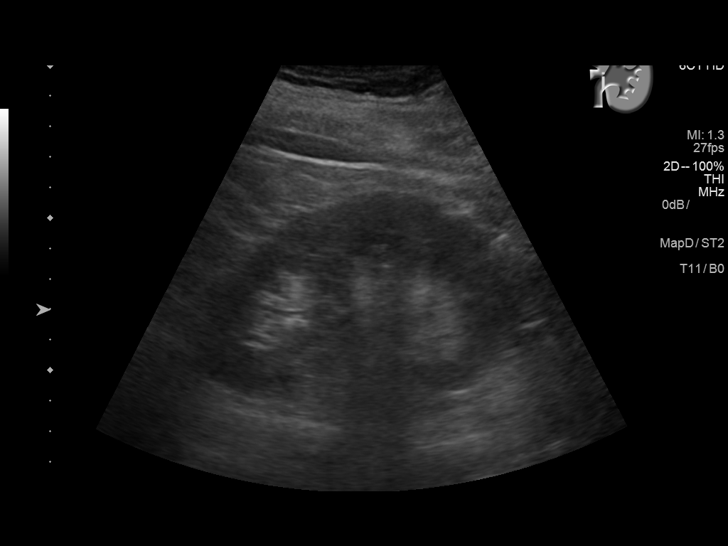
[im 31/49]
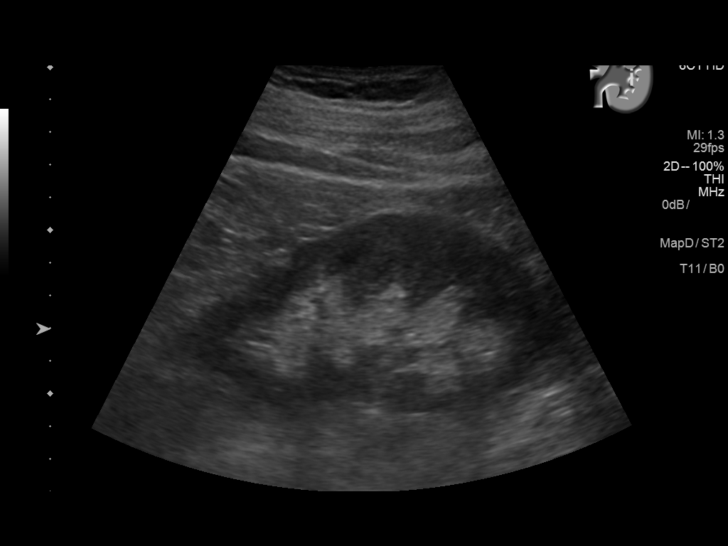
[im 33/49]
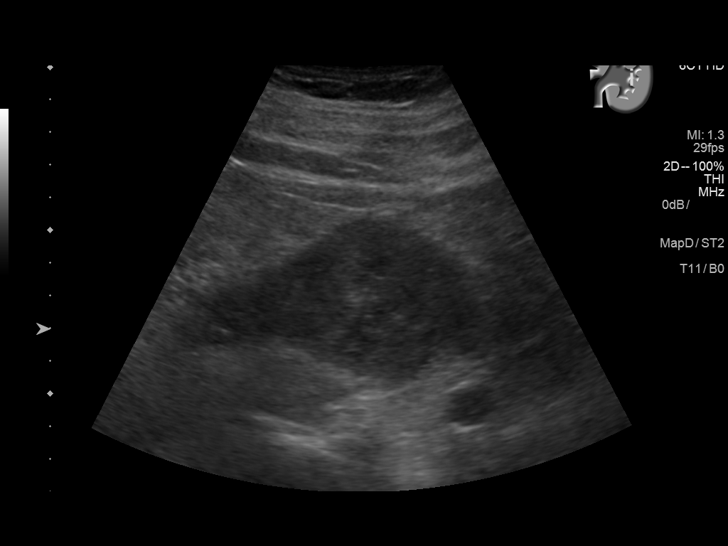
[im 37/49]
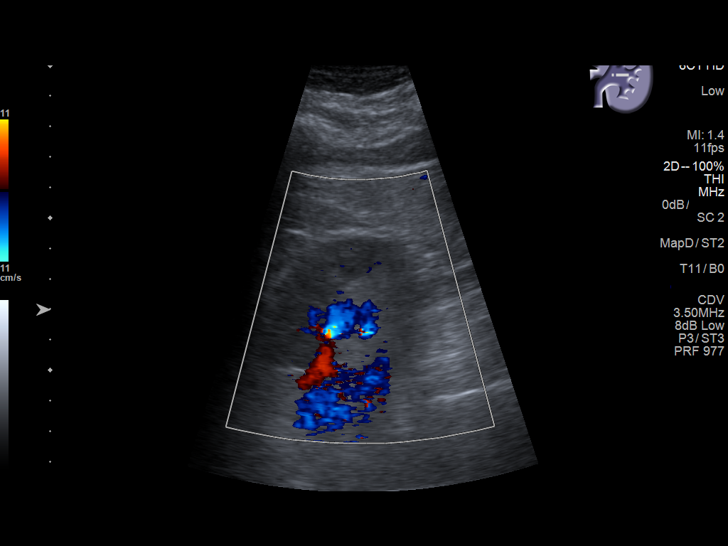
[im 41/49]
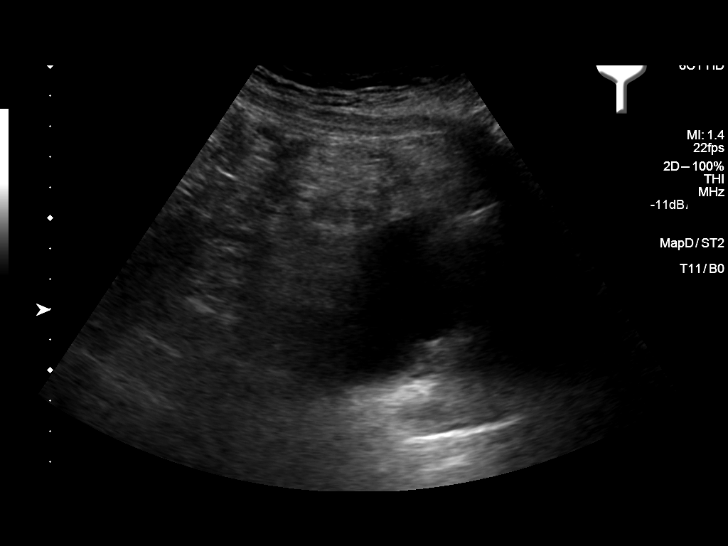
[im 45/49]
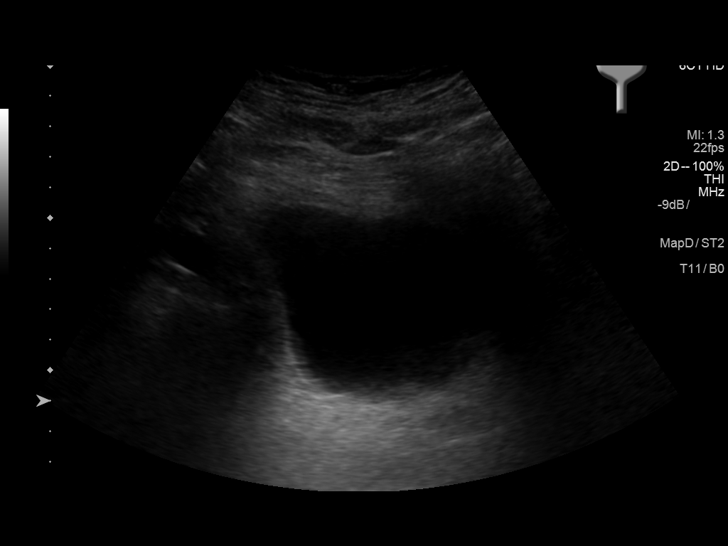
[im 49/49]
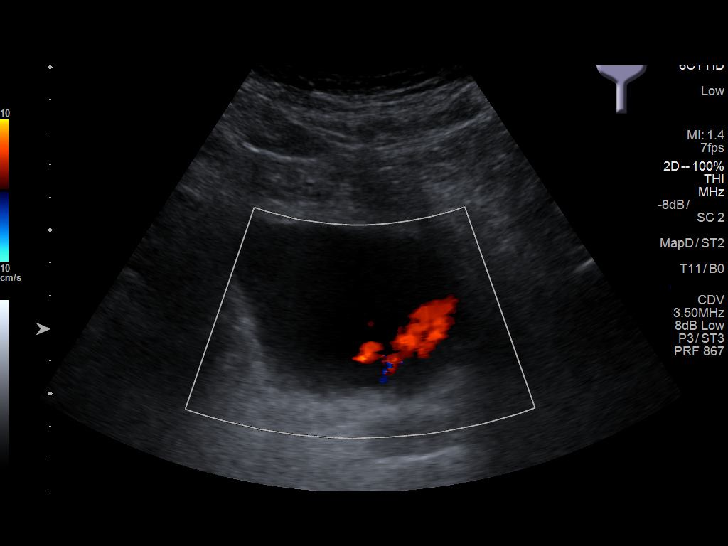

[14 of 25 positions shown; findings below may reference images not displayed]

FINDINGS: Right Kidney:

Length: 12.1 cm. Echogenicity within normal limits. No mass or
hydronephrosis visualized.

Left Kidney:

Length: 12.4 cm. Echogenicity within normal limits. No mass or
hydronephrosis visualized. No renal calculi.

Bladder:

Appears normal for degree of bladder distention. Bilateral ureteral
jets.
IMPRESSION: No hydronephrosis or renal calculi.  Unremarkable urinary bladder.

## 2020-03-30 ENCOUNTER — Ambulatory Visit
Admission: EM | Admit: 2020-03-30 | Discharge: 2020-03-30 | Disposition: A | Discharge status: Home / Self Care | Payer: Medicare Other | Attending: Internal Medicine | Admitting: Internal Medicine

## 2020-03-30 ENCOUNTER — Other Ambulatory Visit: Payer: Self-pay

## 2020-03-30 ENCOUNTER — Ambulatory Visit (INDEPENDENT_AMBULATORY_CARE_PROVIDER_SITE_OTHER): Payer: Medicare Other

## 2020-03-30 DIAGNOSIS — E785 Hyperlipidemia, unspecified: Secondary | ICD-10-CM | POA: Insufficient documentation

## 2020-03-30 DIAGNOSIS — R059 Cough, unspecified: Secondary | ICD-10-CM

## 2020-03-30 DIAGNOSIS — Z20822 Contact with and (suspected) exposure to covid-19: Secondary | ICD-10-CM | POA: Diagnosis not present

## 2020-03-30 DIAGNOSIS — Z7982 Long term (current) use of aspirin: Secondary | ICD-10-CM | POA: Insufficient documentation

## 2020-03-30 DIAGNOSIS — R053 Chronic cough: Secondary | ICD-10-CM | POA: Diagnosis not present

## 2020-03-30 DIAGNOSIS — Z79899 Other long term (current) drug therapy: Secondary | ICD-10-CM | POA: Insufficient documentation

## 2020-03-30 DIAGNOSIS — Z7952 Long term (current) use of systemic steroids: Secondary | ICD-10-CM | POA: Diagnosis not present

## 2020-03-30 DIAGNOSIS — I2510 Atherosclerotic heart disease of native coronary artery without angina pectoris: Secondary | ICD-10-CM | POA: Insufficient documentation

## 2020-03-30 DIAGNOSIS — I10 Essential (primary) hypertension: Secondary | ICD-10-CM | POA: Diagnosis not present

## 2020-03-30 LAB — SARS CORONAVIRUS 2 (TAT 6-24 HRS): SARS Coronavirus 2: NEGATIVE

## 2020-03-30 MED ORDER — PREDNISONE 20 MG PO TABS: 20.0000 mg | ORAL_TABLET | Freq: Every day | ORAL | 0 refills | Status: AC

## 2020-03-30 MED ORDER — HYDROCODONE-HOMATROPINE 5-1.5 MG/5ML PO SYRP: 5.0000 mL | ORAL_SOLUTION | Freq: Four times a day (QID) | ORAL | 0 refills | Status: DC | PRN

## 2020-03-30 MED ORDER — ALBUTEROL SULFATE HFA 108 (90 BASE) MCG/ACT IN AERS: 1.0000 | INHALATION_SPRAY | Freq: Four times a day (QID) | RESPIRATORY_TRACT | 0 refills | Status: AC | PRN

## 2020-03-30 NOTE — ED Triage Notes (Signed)
Patient in today w/ c/o cough x approx. 10 days and chest pain from coughing. Patient reports no other sxs.   Patient is vaccinated against COVID-19.

## 2020-03-30 NOTE — ED Provider Notes (Signed)
[facility]    CSN: [id number] Arrival date & time: 03/30/20  1056      History   Chief Complaint Chief Complaint  Patient presents with  . Cough  . Chest Pain    Coughing    HPI Shane Peters is a 86 y.o. male, comes to the urgent care with complaints of nonproductive cough of about 2 weeks duration.  Patient denies any preceding upper respiratory infection symptoms.  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  Patient denies any wheezing or sputum production.  No febrile episodes.  No chest tightness.  Patient is fully vaccinated against COVID-19 infection.  No sick contacts.  He is being managed with azelastine, Tessalon Perles and sertraline with no improvement in his symptoms.  No weight loss.   HPI  Past Medical History:  Diagnosis Date  . Coronary artery disease   . Hyperlipidemia   . Hypertension     Patient Active Problem List   Diagnosis Date Noted  . Sepsis (HCC) 10/05/2015    Past Surgical History:  Procedure Laterality Date  . ARTERIAL BYPASS SURGRY    . HERNIA REPAIR         Home Medications    Prior to Admission medications   Medication Sig Start Date End Date Taking? Authorizing Provider  aspirin EC 81 MG tablet Take 81 mg by mouth at bedtime.   Yes [provider]  atenolol (TENORMIN) 100 MG tablet Take 100 mg by mouth daily.   Yes [provider]  atorvastatin (LIPITOR) 80 MG tablet Take 80 mg by mouth at bedtime.    Yes [provider]  azelastine (ASTELIN) 0.1 % nasal spray Place 2 sprays into both nostrils 2 (two) times daily. 03/24/20  Yes [provider]  Cinnamon 500 MG capsule Take 500 mg by mouth at bedtime.   Yes [provider]  ciprofloxacin (CIPRO) 500 MG tablet Take 1 tablet (500 mg total) by mouth 2 (two) times daily. 10/08/15  Yes Shaune Pollack, MD  esomeprazole (NEXIUM) 20 MG capsule Take 20 mg by  mouth daily before breakfast.   Yes [provider]  hydrochlorothiazide (MICROZIDE) 12.5 MG capsule Take 12.5 mg by mouth daily.   Yes [provider]  levothyroxine (SYNTHROID, LEVOTHROID) 75 MCG tablet Take 75 mcg by mouth daily before breakfast.   Yes [provider]  montelukast (SINGULAIR) 10 MG tablet Take 10 mg by mouth daily. 03/24/20  Yes [provider]  nitroGLYCERIN (NITROSTAT) 0.4 MG SL tablet Instructions:  sublingual as needed for chest pain 09/30/13  Yes [provider]  omega-3 acid ethyl esters (LOVAZA) 1 g capsule Take 1 g by mouth at bedtime.   Yes [provider]  albuterol (VENTOLIN HFA) 108 (90 Base) MCG/ACT inhaler Inhale 1-2 puffs into the lungs every 6 (six) hours as needed for wheezing or shortness of breath. 03/30/20   Brooklee Michelin, Britta Mccreedy, MD  HYDROcodone-homatropine (HYCODAN) 5-1.5 MG/5ML syrup Take 5 mLs by mouth every 6 (six) hours as needed for cough. 03/30/20   Myles Mallicoat, Britta Mccreedy, MD  predniSONE (DELTASONE) 20 MG tablet Take 1 tablet (20 mg total) by mouth daily for 5 days. 03/30/20 04/04/20  Merrilee Jansky, MD    Family History Family History  Problem Relation Age of Onset  . Hypertension Other     Social History Social History   Tobacco  Use  . Smoking status: Never Smoker  . Smokeless tobacco: Never Used  Substance Use Topics  . Alcohol use: No  . Drug use: Not on file     Allergies   Atorvastatin and Cat hair extract   Review of Systems Review of Systems  HENT: Positive for congestion. Negative for facial swelling, hearing loss, postnasal drip and rhinorrhea.   Eyes: Negative.   Respiratory: Positive for cough and shortness of breath. Negative for choking, chest tightness and wheezing.   Cardiovascular: Negative.   Gastrointestinal: Negative.   Neurological: Negative.      Physical Exam Triage Vital Signs ED Triage Vitals  Enc Vitals Group     BP 03/30/20 1114 124/78     Pulse Rate  03/30/20 1114 75     Resp 03/30/20 1114 18     Temp 03/30/20 1114 98.5 F (36.9 C)     Temp Source 03/30/20 1114 Oral     SpO2 03/30/20 1114 99 %     Weight 03/30/20 1116 190 lb (86.2 kg)     Height 03/30/20 1116 6' (1.829 m)     Head Circumference --      Peak Flow --      Pain Score 03/30/20 1116 0     Pain Loc --      Pain Edu? --      Excl. in GC? --    No data found.  Updated Vital Signs BP 124/78 (BP Location: Right Arm)   Pulse 75   Temp 98.5 F (36.9 C) (Oral)   Resp 18   Ht 6' (1.829 m)   Wt 86.2 kg   SpO2 99%   BMI 25.77 kg/m   Visual Acuity Right Eye Distance:   Left Eye Distance:   Bilateral Distance:    Right Eye Near:   Left Eye Near:    Bilateral Near:     Physical Exam Vitals and nursing note reviewed.  Constitutional:      General: He is not in acute distress.    Appearance: He is not ill-appearing.  Cardiovascular:     Rate and Rhythm: Normal rate and regular rhythm.     Heart sounds: Normal heart sounds.  Pulmonary:     Effort: Pulmonary effort is normal. No tachypnea or accessory muscle usage.     Breath sounds: Normal breath sounds. No decreased breath sounds, wheezing, rhonchi or rales.  Abdominal:     Palpations: Abdomen is soft.  Neurological:     Mental Status: He is alert.      UC Treatments / Results  Labs (all labs ordered are listed, but only abnormal results are displayed) Labs Reviewed  SARS CORONAVIRUS 2 (TAT 6-24 HRS)    EKG   Radiology DG Chest 2 View  Result Date: 03/30/2020 CLINICAL DATA:  Cough for 10 days, chest pain with coughing, vaccinated for COVID EXAM: CHEST - 2 VIEW COMPARISON:  10/05/2015 FINDINGS: Minimal enlargement of cardiac silhouette post median sternotomy. Atherosclerotic calcifications and tortuosity of thoracic aorta. Pulmonary vascularity normal. Minimal chronic basilar interstitial prominence unchanged. No acute infiltrate, pleural effusion or pneumothorax. Bones demineralized. IMPRESSION:  Minimal enlargement of cardiac silhouette and chronic basilar interstitial prominence. No acute abnormalities. Electronically Signed   By: Ulyses Southward M.D.   On: 03/30/2020 13:12    Procedures Procedures (including critical care time)  Medications Ordered in UC Medications - No data to display  Initial Impression / Assessment and Plan / UC Course  I have reviewed the  triage vital signs and the nursing notes.  Pertinent labs & imaging results that were available during my care of the patient were reviewed by me and considered in my medical decision making (see chart for details).     1.  Chronic cough: Chest x-ray is negative for acute lung infiltrate Discontinue Tessalon Perles Hycodan every 6 hours as needed for cough Prednisone 20 mg orally daily for 5 days Albuterol inhaler Patient encouraged to wear face/nose mask when working with wood. Final Clinical Impressions(s) / UC Diagnoses   Final diagnoses:  Chronic cough     Discharge Instructions     Please wear a mask when doing woodwork Take medications as prescribed Return to urgent care if cough remains persistent or symptoms worsen.   ED Prescriptions    Medication Sig Dispense Auth. Provider   predniSONE (DELTASONE) 20 MG tablet Take 1 tablet (20 mg total) by mouth daily for 5 days. 5 tablet Dallas Scorsone, Britta Mccreedy, MD   albuterol (VENTOLIN HFA) 108 (90 Base) MCG/ACT inhaler Inhale 1-2 puffs into the lungs every 6 (six) hours as needed for wheezing or shortness of breath. 18 g Nykiah Ma, Britta Mccreedy, MD   HYDROcodone-homatropine [facility]) 5-1.5 MG/5ML syrup Take 5 mLs by mouth every 6 (six) hours as needed for cough. 120 mL Laycie Schriner, Britta Mccreedy, MD     I have reviewed the PDMP during this encounter.   Merrilee Jansky, MD 03/30/20 [phone]

## 2020-03-30 NOTE — Discharge Instructions (Signed)
Please wear a mask when doing woodwork Take medications as prescribed Return to urgent care if cough remains persistent or symptoms worsen.

## 2020-11-13 ENCOUNTER — Ambulatory Visit
Admission: EM | Admit: 2020-11-13 | Discharge: 2020-11-13 | Disposition: A | Discharge status: Home / Self Care | Payer: Medicare Other | Attending: Family Medicine | Admitting: Family Medicine

## 2020-11-13 ENCOUNTER — Other Ambulatory Visit: Payer: Self-pay

## 2020-11-13 DIAGNOSIS — L03115 Cellulitis of right lower limb: Secondary | ICD-10-CM

## 2020-11-13 MED ORDER — DOXYCYCLINE HYCLATE 100 MG PO CAPS: 100.0000 mg | ORAL_CAPSULE | Freq: Two times a day (BID) | ORAL | 0 refills | Status: AC

## 2020-11-13 MED ORDER — MUPIROCIN 2 % EX OINT: 1.0000 "application " | TOPICAL_OINTMENT | Freq: Three times a day (TID) | CUTANEOUS | 0 refills | Status: AC

## 2020-11-13 NOTE — Discharge Instructions (Signed)
Medication as prescribed.  Take care  Dr. Alazia Crocket  

## 2020-11-13 NOTE — ED Triage Notes (Signed)
Pt c/o possible bug bite to his inner/lower right leg on Wednesday. Pt states it may have been a tick. The area does have a center and a red rash appearing area around it. Pt states the area does itch. Pt denies f/n/v/d or other symptoms.

## 2020-11-13 NOTE — ED Provider Notes (Signed)
[facility]    CSN: [id number] Arrival date & time: 11/13/20  1447      History   Chief Complaint Chief Complaint  Patient presents with  . Insect Bite    RLE   HPI  87 year old male presents with the above complaint.  Patient reports that 4 days ago he felt like he was bitten by an insect on his right lower leg.  The area has now worsened and has gotten red.  He does note some itching.  No fever.  No known tick bite.  No insect visualized.  No relieving factors.  No other complaints.  Past Medical History:  Diagnosis Date  . Coronary artery disease   . Hyperlipidemia   . Hypertension    Patient Active Problem List   Diagnosis Date Noted  . Sepsis (HCC) 10/05/2015   Past Surgical History:  Procedure Laterality Date  . ARTERIAL BYPASS SURGRY    . HERNIA REPAIR      Home Medications    Prior to Admission medications   Medication Sig Start Date End Date Taking? Authorizing Provider  albuterol (VENTOLIN HFA) 108 (90 Base) MCG/ACT inhaler Inhale 1-2 puffs into the lungs every 6 (six) hours as needed for wheezing or shortness of breath. 03/30/20  Yes Lamptey, Britta Mccreedy, MD  aspirin EC 81 MG tablet Take 81 mg by mouth at bedtime.   Yes [provider]  atenolol (TENORMIN) 100 MG tablet Take 100 mg by mouth daily.   Yes [provider]  atorvastatin (LIPITOR) 80 MG tablet Take 80 mg by mouth at bedtime.    Yes [provider]  azelastine (ASTELIN) 0.1 % nasal spray Place 2 sprays into both nostrils 2 (two) times daily. 03/24/20  Yes [provider]  Cinnamon 500 MG capsule Take 500 mg by mouth at bedtime.   Yes [provider]  doxycycline (VIBRAMYCIN) 100 MG capsule Take 1 capsule (100 mg total) by mouth 2 (two) times daily. 11/13/20  Yes Aliahna Statzer G, DO  esomeprazole (NEXIUM) 20 MG capsule Take 20 mg by mouth daily before breakfast.   Yes [provider]  hydrochlorothiazide (MICROZIDE) 12.5 MG capsule Take  12.5 mg by mouth daily.   Yes [provider]  levothyroxine (SYNTHROID, LEVOTHROID) 75 MCG tablet Take 75 mcg by mouth daily before breakfast.   Yes [provider]  mupirocin ointment (BACTROBAN) 2 % Apply 1 application topically 3 (three) times daily for 7 days. 11/13/20 11/20/20 Yes Colton Tassin G, DO  nitroGLYCERIN (NITROSTAT) 0.4 MG SL tablet Instructions:  sublingual as needed for chest pain 09/30/13  Yes [provider]  omega-3 acid ethyl esters (LOVAZA) 1 g capsule Take 1 g by mouth at bedtime.   Yes [provider]  montelukast (SINGULAIR) 10 MG tablet Take 10 mg by mouth daily. 03/24/20   [provider]    Family History Family History  Problem Relation Age of Onset  . Hypertension Other     Social History Social History   Tobacco Use  . Smoking status: Never Smoker  . Smokeless tobacco: Never Used  Vaping Use  . Vaping Use: Never used  Substance Use Topics  . Alcohol use: No     Allergies   Atorvastatin and Cat hair extract   Review of Systems Review of Systems  Constitutional: Negative for fever.  Skin: Positive for rash.   Physical Exam Triage Vital Signs ED Triage Vitals  Enc Vitals Group     BP 11/13/20  1505 126/78     Pulse Rate 11/13/20 1505 69     Resp 11/13/20 1505 18     Temp 11/13/20 1505 98.3 F (36.8 C)     Temp Source 11/13/20 1505 Oral     SpO2 11/13/20 1505 99 %     Weight 11/13/20 1502 190 lb (86.2 kg)     Height 11/13/20 1502 6' (1.829 m)     Head Circumference --      Peak Flow --      Pain Score 11/13/20 1502 0     Pain Loc --      Pain Edu? --      Excl. in GC? --    Updated Vital Signs BP 126/78 (BP Location: Left Arm)   Pulse 69   Temp 98.3 F (36.8 C) (Oral)   Resp 18   Ht 6' (1.829 m)   Wt 86.2 kg   SpO2 99%   BMI 25.77 kg/m   Visual Acuity Right Eye Distance:   Left Eye Distance:   Bilateral Distance:    Right Eye Near:   Left Eye Near:    Bilateral Near:      Physical Exam Vitals and nursing note reviewed.  Constitutional:      General: He is not in acute distress.    Appearance: Normal appearance. He is not ill-appearing.  HENT:     Head: Normocephalic and atraumatic.  Eyes:     General:        Right eye: No discharge.        Left eye: No discharge.     Conjunctiva/sclera: Conjunctivae normal.  Pulmonary:     Effort: Pulmonary effort is normal. No respiratory distress.  Skin:    Comments: Medial aspect of the right lower leg with an area of erythema.  Slight induration.  Slightly warm.  Neurological:     Mental Status: He is alert.  Psychiatric:        Mood and Affect: Mood normal.        Behavior: Behavior normal.    UC Treatments / Results  Labs (all labs ordered are listed, but only abnormal results are displayed) Labs Reviewed - No data to display  EKG   Radiology No results found.  Procedures Procedures (including critical care time)  Medications Ordered in UC Medications - No data to display  Initial Impression / Assessment and Plan / UC Course  I have reviewed the triage vital signs and the nursing notes.  Pertinent labs & imaging results that were available during my care of the patient were reviewed by me and considered in my medical decision making (see chart for details).    87 year old male presents with cellulitis of the right lower extremity.  Due to recent insect bite.  Treating with doxycycline and Bactroban ointment.  Supportive care.  Final Clinical Impressions(s) / UC Diagnoses   Final diagnoses:  Cellulitis of right lower extremity     Discharge Instructions     Medication as prescribed.  Take care  Dr. Adriana Simas    ED Prescriptions    Medication Sig Dispense Auth. Provider   doxycycline (VIBRAMYCIN) 100 MG capsule Take 1 capsule (100 mg total) by mouth 2 (two) times daily. 20 capsule [place], Leandrea Ackley G, DO   mupirocin ointment (BACTROBAN) 2 % Apply 1 application topically 3 (three) times  daily for 7 days. 30 g Tommie Sams, DO     PDMP not reviewed this encounter.   Adriana Simas,  Emilija Bohman G, DO 11/13/20 1604

## 2021-01-26 IMAGING — CR DG CHEST 2V
2 series · 2 of 2 positions shown · non-contrast
Comparison: 10/05/2015

CLINICAL DATA: Cough for 10 days, chest pain with coughing,
vaccinated for COVID

EXAM:
CHEST - 2 VIEW

[chest pa]
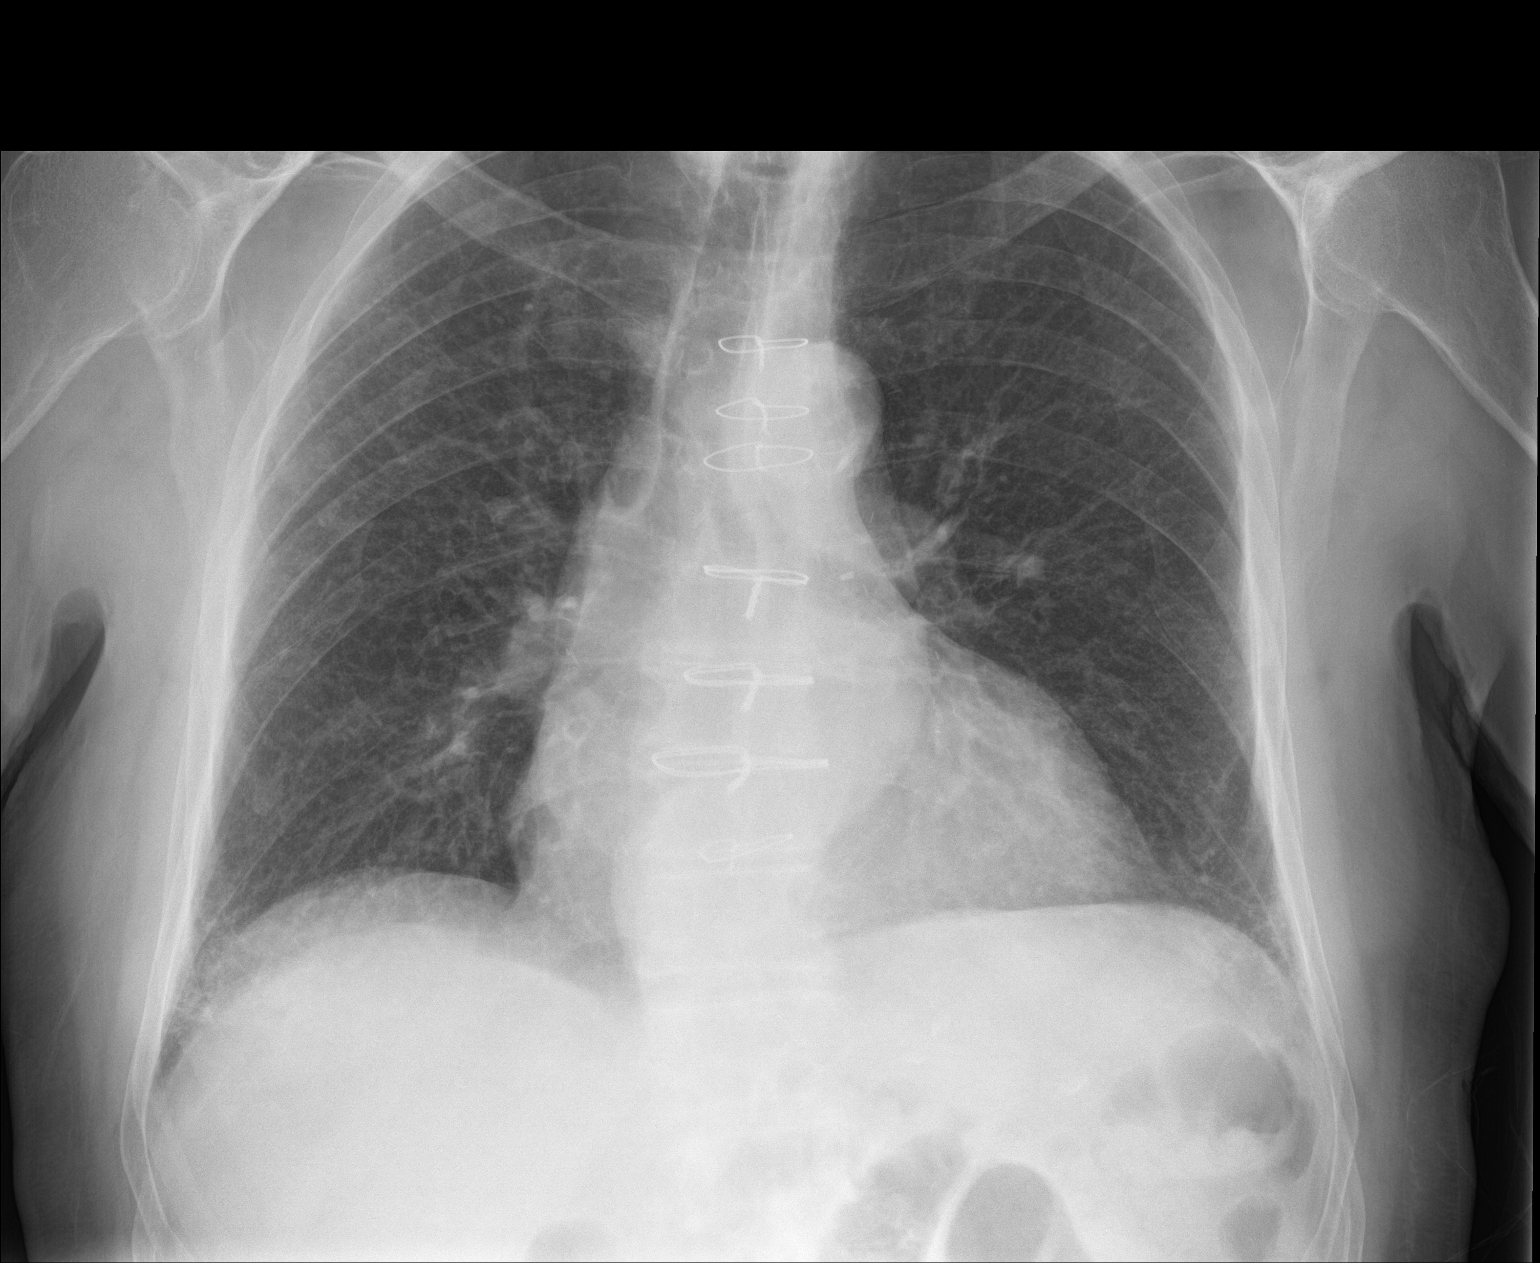

[chest lat]
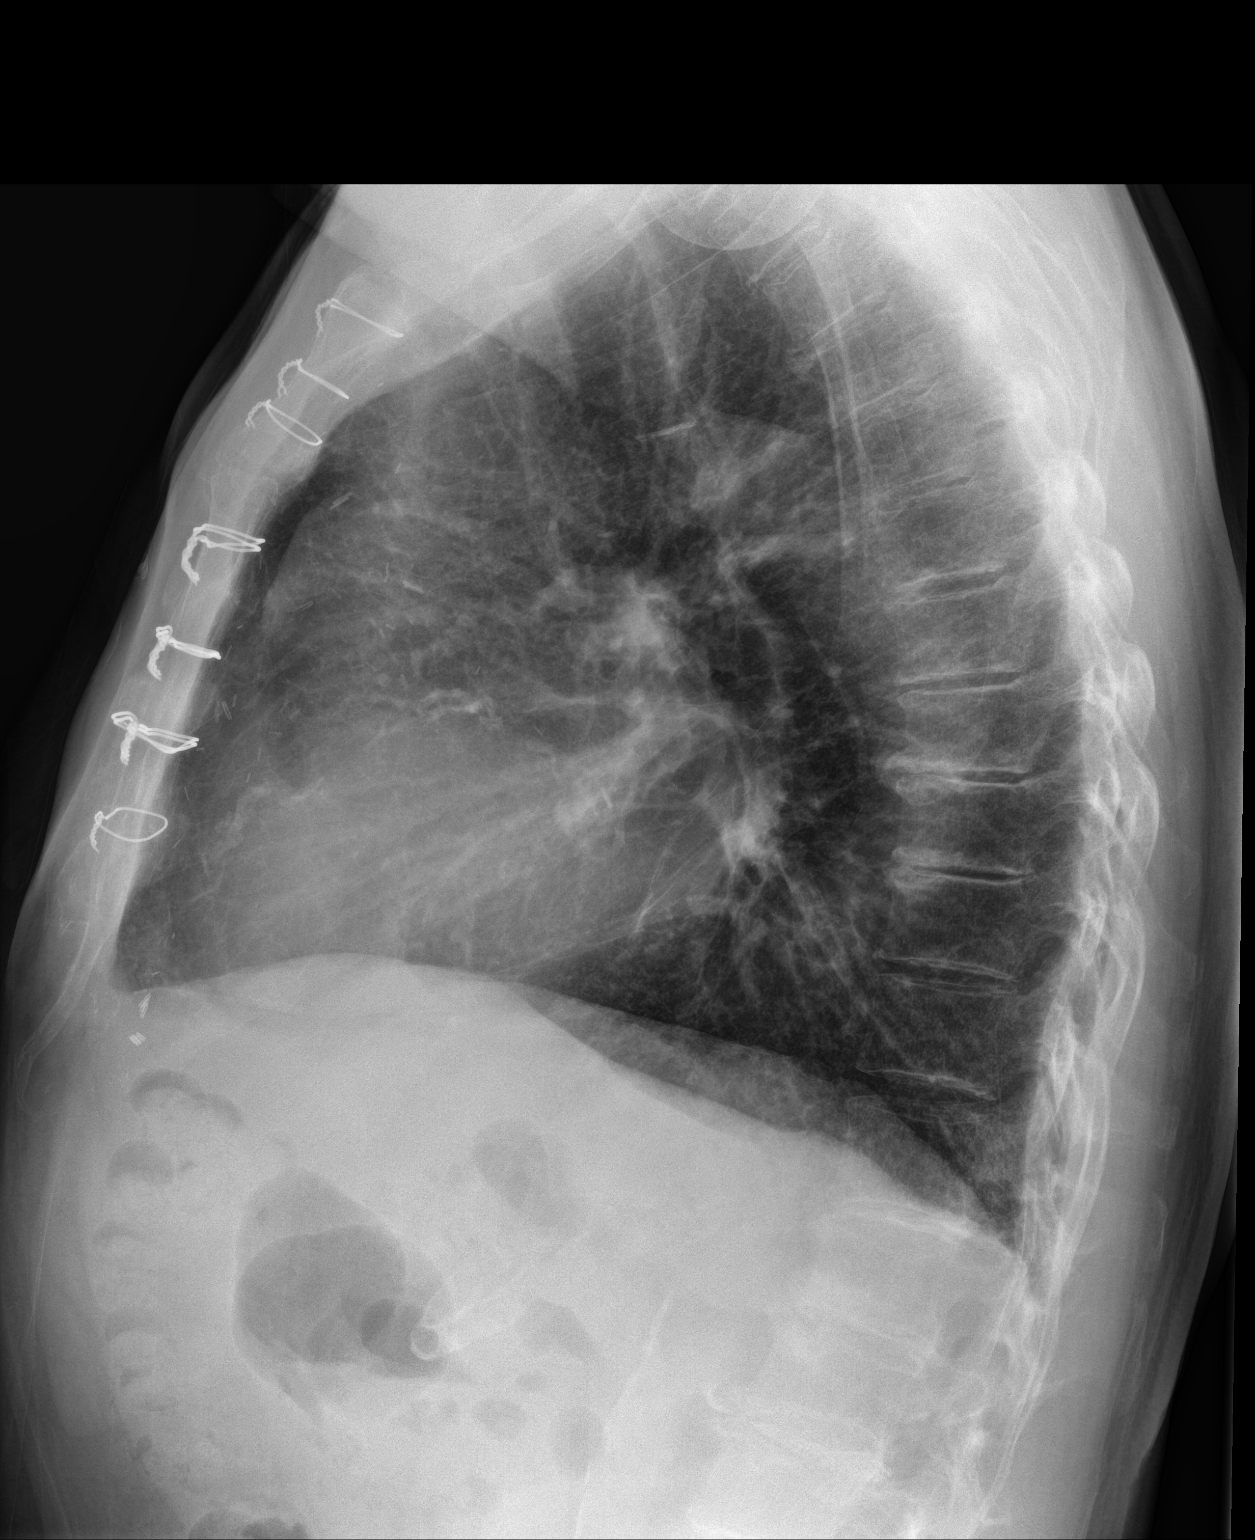

[2 of 2 positions shown; findings below may reference images not displayed]

FINDINGS: Minimal enlargement of cardiac silhouette post median sternotomy.

Atherosclerotic calcifications and tortuosity of thoracic aorta.

Pulmonary vascularity normal.

Minimal chronic basilar interstitial prominence unchanged.

No acute infiltrate, pleural effusion or pneumothorax.

Bones demineralized.
IMPRESSION: Minimal enlargement of cardiac silhouette and chronic basilar
interstitial prominence.

No acute abnormalities.

## 2023-06-13 ENCOUNTER — Other Ambulatory Visit: Payer: Self-pay

## 2023-06-13 ENCOUNTER — Encounter: Payer: Medicare Other | Attending: Cardiology

## 2023-06-13 DIAGNOSIS — Z5189 Encounter for other specified aftercare: Secondary | ICD-10-CM | POA: Insufficient documentation

## 2023-06-13 DIAGNOSIS — I214 Non-ST elevation (NSTEMI) myocardial infarction: Secondary | ICD-10-CM

## 2023-06-13 DIAGNOSIS — I252 Old myocardial infarction: Secondary | ICD-10-CM | POA: Insufficient documentation

## 2023-06-13 NOTE — Progress Notes (Signed)
Virtual Visit completed. Patient informed on EP and RD appointment and 6 Minute walk test. Patient also informed of patient health questionnaires on My Chart. Patient Verbalizes understanding. Visit diagnosis can be found in [facility] 02/25/2023.

## 2023-06-18 VITALS — Ht 69.0 in | Wt 177.9 lb

## 2023-06-18 DIAGNOSIS — I214 Non-ST elevation (NSTEMI) myocardial infarction: Secondary | ICD-10-CM

## 2023-06-18 DIAGNOSIS — Z5189 Encounter for other specified aftercare: Secondary | ICD-10-CM | POA: Diagnosis present

## 2023-06-18 DIAGNOSIS — I252 Old myocardial infarction: Secondary | ICD-10-CM | POA: Diagnosis not present

## 2023-06-18 NOTE — Progress Notes (Signed)
Cardiac Individual Treatment Plan  Patient Details  Name: Shane Peters MRN: [id number] Date of Birth: 06/13/1934 Referring Provider:   Flowsheet Row Cardiac Rehab from 06/18/2023 in [facility] Cardiac and Pulmonary Rehab  Referring Provider Dr. Massie Maroon, MD       Initial Encounter Date:  Flowsheet Row Cardiac Rehab from 06/18/2023 in [facility] Cardiac and Pulmonary Rehab  Date 06/18/23       Visit Diagnosis: NSTEMI (non-ST elevated myocardial infarction) [facility])  Patient's Home Medications on Admission:  Current Outpatient Medications:    albuterol (VENTOLIN HFA) 108 (90 Base) MCG/ACT inhaler, Inhale 1-2 puffs into the lungs every 6 (six) hours as needed for wheezing or shortness of breath. (Patient not taking: Reported on 06/13/2023), Disp: 18 g, Rfl: 0   amiodarone (PACERONE) 200 MG tablet, Take 200 mg by mouth daily. (Patient not taking: Reported on 06/13/2023), Disp: , Rfl:    apixaban (ELIQUIS) 5 MG TABS tablet, Take by mouth., Disp: , Rfl:    aspirin EC 81 MG tablet, Take 81 mg by mouth at bedtime. (Patient not taking: Reported on 06/13/2023), Disp: , Rfl:    aspirin EC 81 MG tablet, Take 1 tablet by mouth daily., Disp: , Rfl:    atenolol (TENORMIN) 100 MG tablet, Take 100 mg by mouth daily. (Patient not taking: Reported on 06/13/2023), Disp: , Rfl:    atorvastatin (LIPITOR) 80 MG tablet, Take 80 mg by mouth at bedtime.  (Patient not taking: Reported on 06/13/2023), Disp: , Rfl:    atorvastatin (LIPITOR) 80 MG tablet, Take by mouth. (Patient not taking: Reported on 06/13/2023), Disp: , Rfl:    azelastine (ASTELIN) 0.1 % nasal spray, Place 2 sprays into both nostrils 2 (two) times daily. (Patient not taking: Reported on 06/13/2023), Disp: , Rfl:    Cinnamon 500 MG capsule, Take 500 mg by mouth at bedtime. (Patient not taking: Reported on 06/13/2023), Disp: , Rfl:    clopidogrel (PLAVIX) 75 MG tablet, Take 75 mg by mouth daily., Disp: , Rfl:    diphenhydramine-acetaminophen (TYLENOL PM)  25-500 MG TABS tablet, Take 2 tablets by mouth at bedtime., Disp: , Rfl:    doxycycline (VIBRAMYCIN) 100 MG capsule, Take 1 capsule (100 mg total) by mouth 2 (two) times daily. (Patient not taking: Reported on 06/13/2023), Disp: 20 capsule, Rfl: 0   empagliflozin (JARDIANCE) 10 MG TABS tablet, Take by mouth., Disp: , Rfl:    esomeprazole (NEXIUM) 20 MG capsule, Take 20 mg by mouth daily before breakfast. (Patient not taking: Reported on 06/13/2023), Disp: , Rfl:    esomeprazole (NEXIUM) 20 MG capsule, Take by mouth. (Patient not taking: Reported on 06/13/2023), Disp: , Rfl:    esomeprazole (NEXIUM) 40 MG capsule, Take by mouth., Disp: , Rfl:    ezetimibe (ZETIA) 10 MG tablet, Take 10 mg by mouth daily., Disp: , Rfl:    fluticasone (FLONASE) 50 MCG/ACT nasal spray, 1 spray into each nostril daily. Frequency:QD   Dosage:50   MCG  Instructions:  Note:Dose: , Disp: , Rfl:    furosemide (LASIX) 40 MG tablet, Take by mouth., Disp: , Rfl:    gabapentin (NEURONTIN) 100 MG capsule, Take by mouth., Disp: , Rfl:    hydrochlorothiazide (MICROZIDE) 12.5 MG capsule, Take 12.5 mg by mouth daily., Disp: , Rfl:    isosorbide mononitrate (IMDUR) 60 MG 24 hr tablet, Take by mouth., Disp: , Rfl:    levothyroxine (SYNTHROID) 75 MCG tablet, Take by mouth. (Patient not taking: Reported on 06/13/2023), Disp: , Rfl:  levothyroxine (SYNTHROID, LEVOTHROID) 75 MCG tablet, Take 75 mcg by mouth daily before breakfast., Disp: , Rfl:    losartan (COZAAR) 25 MG tablet, Take 1 tablet by mouth at bedtime., Disp: , Rfl:    metoprolol succinate (TOPROL-XL) 50 MG 24 hr tablet, Take by mouth., Disp: , Rfl:    montelukast (SINGULAIR) 10 MG tablet, Take 10 mg by mouth daily. (Patient not taking: Reported on 06/13/2023), Disp: , Rfl:    nitroGLYCERIN (NITROSTAT) 0.4 MG SL tablet, Instructions:  sublingual as needed for chest pain, Disp: , Rfl:    omega-3 acid ethyl esters (LOVAZA) 1 g capsule, Take 1 g by mouth at bedtime., Disp: ,  Rfl:    omega-3 acid ethyl esters (LOVAZA) 1 g capsule, Take 2 capsules by mouth daily. (Patient not taking: Reported on 06/13/2023), Disp: , Rfl:    ranolazine (RANEXA) 1000 MG SR tablet, Take 1,000 mg by mouth 2 (two) times daily., Disp: , Rfl:    ranolazine (RANEXA) 500 MG 12 hr tablet, Take 500 mg by mouth 2 (two) times daily. (Patient not taking: Reported on 06/13/2023), Disp: , Rfl:    rosuvastatin (CRESTOR) 20 MG tablet, Take 20 mg by mouth at bedtime., Disp: , Rfl:    spironolactone (ALDACTONE) 25 MG tablet, Take 0.5 tablets by mouth daily., Disp: , Rfl:   Past Medical History: Past Medical History:  Diagnosis Date   Coronary artery disease    Hyperlipidemia    Hypertension     Tobacco Use: Social History   Tobacco Use  Smoking Status Never  Smokeless Tobacco Never    Labs: Review Flowsheet        No data to display           Exercise Target Goals: Exercise Program Goal: Individual exercise prescription set using results from initial 6 min walk test and THRR while considering  patient's activity barriers and safety.   Exercise Prescription Goal: Initial exercise prescription builds to 30-45 minutes a day of aerobic activity, 2-3 days per week.  Home exercise guidelines will be given to patient during program as part of exercise prescription that the participant will acknowledge.   Education: Aerobic Exercise: - Group verbal and visual presentation on the components of exercise prescription. Introduces F.I.T.T principle from ACSM for exercise prescriptions.  Reviews F.I.T.T. principles of aerobic exercise including progression. Written material given at graduation. Flowsheet Row Cardiac Rehab from 06/18/2023 in [facility] Cardiac and Pulmonary Rehab  Education need identified 06/18/23       Education: Resistance Exercise: - Group verbal and visual presentation on the components of exercise prescription. Introduces F.I.T.T principle from ACSM for exercise  prescriptions  Reviews F.I.T.T. principles of resistance exercise including progression. Written material given at graduation.    Education: Exercise & Equipment Safety: - Individual verbal instruction and demonstration of equipment use and safety with use of the equipment. Flowsheet Row Cardiac Rehab from 06/18/2023 in [facility] Cardiac and Pulmonary Rehab  Date 06/13/23  Educator [facility]  Instruction Review Code 1- Verbalizes Understanding       Education: Exercise Physiology & General Exercise Guidelines: - Group verbal and written instruction with models to review the exercise physiology of the cardiovascular system and associated critical values. Provides general exercise guidelines with specific guidelines to those with heart or lung disease.    Education: Flexibility, Balance, Mind/Body Relaxation: - Group verbal and visual presentation with interactive activity on the components of exercise prescription. Introduces F.I.T.T principle from ACSM for exercise prescriptions. Reviews F.I.T.T. principles of  flexibility and balance exercise training including progression. Also discusses the mind body connection.  Reviews various relaxation techniques to help reduce and manage stress (i.e. Deep breathing, progressive muscle relaxation, and visualization). Balance handout provided to take home. Written material given at graduation.   Activity Barriers & Risk Stratification:  Activity Barriers & Cardiac Risk Stratification - 06/18/23 1546       Activity Barriers & Cardiac Risk Stratification   Activity Barriers Back Problems;Deconditioning;Muscular Weakness;Balance Concerns    Cardiac Risk Stratification High             6 Minute Walk:  6 Minute Walk     Row Name 06/18/23 1544         6 Minute Walk   Phase Initial     Distance 660 feet     Walk Time 6 minutes     # of Rest Breaks 0     MPH 1.25     METS 1     RPE 9     Perceived Dyspnea  0     VO2 Peak 3.11     Symptoms Yes  (comment)     Comments hip fatigue     Resting HR 60 bpm     Resting BP 110/58     Resting Oxygen Saturation  98 %     Exercise Oxygen Saturation  during 6 min walk 95 %     Max Ex. HR 96 bpm     Max Ex. BP 126/60     2 Minute Post BP 114/54              Oxygen Initial Assessment:   Oxygen Re-Evaluation:   Oxygen Discharge (Final Oxygen Re-Evaluation):   Initial Exercise Prescription:  Initial Exercise Prescription - 06/18/23 1500       Date of Initial Exercise RX and Referring Provider   Date 06/18/23    Referring Provider Dr. Massie Maroon, MD      Oxygen   Maintain Oxygen Saturation 88% or higher      Treadmill   MPH 1    Grade 0    Minutes 15    METs 1.8      Recumbant Bike   Level 1    RPM 50    Watts 10    Minutes 15    METs 1      NuStep   Level 1    SPM 80    Minutes 15    METs 1      Track   Laps 15    Minutes 15    METs 1.82      Prescription Details   Frequency (times per week) 3    Duration Progress to 30 minutes of continuous aerobic without signs/symptoms of physical distress      Intensity   THRR 40-80% of Max Heartrate 89-118    Ratings of Perceived Exertion 11-13    Perceived Dyspnea 0-4      Progression   Progression Continue to progress workloads to maintain intensity without signs/symptoms of physical distress.      Resistance Training   Training Prescription Yes    Weight 3 lb    Reps 10-15             Perform Capillary Blood Glucose checks as needed.  Exercise Prescription Changes:   Exercise Prescription Changes     Row Name 06/18/23 1500             Response to Exercise  Blood Pressure (Admit) 110/58       Blood Pressure (Exercise) 126/60       Blood Pressure (Exit) 114/54       Heart Rate (Admit) 60 bpm       Heart Rate (Exercise) 96 bpm       Heart Rate (Exit) 65 bpm       Oxygen Saturation (Admit) 98 %       Oxygen Saturation (Exercise) 95 %       Rating of Perceived Exertion (Exercise) 9        Perceived Dyspnea (Exercise) 0       Symptoms hip fatigue       Comments Results                Exercise Comments:   Exercise Goals and Review:   Exercise Goals     Row Name 06/18/23 1547             Exercise Goals   Increase Physical Activity Yes       Intervention Provide advice, education, support and counseling about physical activity/exercise needs.;Develop an individualized exercise prescription for aerobic and resistive training based on initial evaluation findings, risk stratification, comorbidities and participant's personal goals.       Expected Outcomes Short Term: Attend rehab on a regular basis to increase amount of physical activity.;Long Term: Add in home exercise to make exercise part of routine and to increase amount of physical activity.;Long Term: Exercising regularly at least 3-5 days a week.       Increase Strength and Stamina Yes       Intervention Provide advice, education, support and counseling about physical activity/exercise needs.;Develop an individualized exercise prescription for aerobic and resistive training based on initial evaluation findings, risk stratification, comorbidities and participant's personal goals.       Expected Outcomes Short Term: Increase workloads from initial exercise prescription for resistance, speed, and METs.;Short Term: Perform resistance training exercises routinely during rehab and add in resistance training at home;Long Term: Improve cardiorespiratory fitness, muscular endurance and strength as measured by increased METs and functional capacity ( )       Able to understand and use rate of perceived exertion (RPE) scale Yes       Intervention Provide education and explanation on how to use RPE scale       Expected Outcomes Short Term: Able to use RPE daily in rehab to express subjective intensity level;Long Term:  Able to use RPE to guide intensity level when exercising independently       Able to understand and  use Dyspnea scale Yes       Intervention Provide education and explanation on how to use Dyspnea scale       Expected Outcomes Short Term: Able to use Dyspnea scale daily in rehab to express subjective sense of shortness of breath during exertion;Long Term: Able to use Dyspnea scale to guide intensity level when exercising independently       Knowledge and understanding of Target Heart Rate Range (THRR) Yes       Intervention Provide education and explanation of THRR including how the numbers were predicted and where they are located for reference       Expected Outcomes Short Term: Able to state/look up THRR;Long Term: Able to use THRR to govern intensity when exercising independently;Short Term: Able to use daily as guideline for intensity in rehab       Able to check pulse independently Yes  Intervention Provide education and demonstration on how to check pulse in carotid and radial arteries.;Review the importance of being able to check your own pulse for safety during independent exercise       Expected Outcomes Long Term: Able to check pulse independently and accurately;Short Term: Able to explain why pulse checking is important during independent exercise       Understanding of Exercise Prescription Yes       Intervention Provide education, explanation, and written materials on patient's individual exercise prescription       Expected Outcomes Short Term: Able to explain program exercise prescription;Long Term: Able to explain home exercise prescription to exercise independently                Exercise Goals Re-Evaluation :   Discharge Exercise Prescription (Final Exercise Prescription Changes):  Exercise Prescription Changes - 06/18/23 1500       Response to Exercise   Blood Pressure (Admit) 110/58    Blood Pressure (Exercise) 126/60    Blood Pressure (Exit) 114/54    Heart Rate (Admit) 60 bpm    Heart Rate (Exercise) 96 bpm    Heart Rate (Exit) 65 bpm    Oxygen  Saturation (Admit) 98 %    Oxygen Saturation (Exercise) 95 %    Rating of Perceived Exertion (Exercise) 9    Perceived Dyspnea (Exercise) 0    Symptoms hip fatigue    Comments Results             Nutrition:  Target Goals: Understanding of nutrition guidelines, daily intake of sodium 1500mg , cholesterol 200mg , calories 30% from fat and 7% or less from saturated fats, daily to have 5 or more servings of fruits and vegetables.  Education: All About Nutrition: -Group instruction provided by verbal, written material, interactive activities, discussions, models, and posters to present general guidelines for heart healthy nutrition including fat, fiber, MyPlate, the role of sodium in heart healthy nutrition, utilization of the nutrition label, and utilization of this knowledge for meal planning. Follow up email sent as well. Written material given at graduation. Flowsheet Row Cardiac Rehab from 06/18/2023 in [facility] Cardiac and Pulmonary Rehab  Education need identified 06/18/23       Biometrics:  Pre Biometrics - 06/18/23 1548       Pre Biometrics   Height 5\' 9"  (1.753 m)    Weight 177 lb 14.4 oz (80.7 kg)    Waist Circumference 37 inches    Hip Circumference 39 inches    Waist to Hip Ratio 0.95 %    BMI (Calculated) 26.26    Single Leg Stand 2.01 seconds              Nutrition Therapy Plan and Nutrition Goals:  Nutrition Therapy & Goals - 06/18/23 1542       Nutrition Therapy   RD appointment deferred Yes      Intervention Plan   Intervention Prescribe, educate and counsel regarding individualized specific dietary modifications aiming towards targeted core components such as weight, hypertension, lipid management, diabetes, heart failure and other comorbidities.    Expected Outcomes Short Term Goal: Understand basic principles of dietary content, such as calories, fat, sodium, cholesterol and nutrients.;Short Term Goal: A plan has been developed with personal  nutrition goals set during dietitian appointment.;Long Term Goal: Adherence to prescribed nutrition plan.             Nutrition Assessments:  MEDIFICTS Score Key: >=70 Need to make dietary changes  40-70 Heart Healthy  Diet <= 40 Therapeutic Level Cholesterol Diet  Flowsheet Row Cardiac Rehab from 06/18/2023 in [facility] Cardiac and Pulmonary Rehab  Picture Your Plate Total Score on Admission 45      Picture Your Plate Scores: <96 Unhealthy dietary pattern with much room for improvement. 41-50 Dietary pattern unlikely to meet recommendations for good health and room for improvement. 51-60 More healthful dietary pattern, with some room for improvement.  >60 Healthy dietary pattern, although there may be some specific behaviors that could be improved.    Nutrition Goals Re-Evaluation:   Nutrition Goals Discharge (Final Nutrition Goals Re-Evaluation):   Psychosocial: Target Goals: Acknowledge presence or absence of significant depression and/or stress, maximize coping skills, provide positive support system. Participant is able to verbalize types and ability to use techniques and skills needed for reducing stress and depression.   Education: Stress, Anxiety, and Depression - Group verbal and visual presentation to define topics covered.  Reviews how body is impacted by stress, anxiety, and depression.  Also discusses healthy ways to reduce stress and to treat/manage anxiety and depression.  Written material given at graduation.   Education: Sleep Hygiene -Provides group verbal and written instruction about how sleep can affect your health.  Define sleep hygiene, discuss sleep cycles and impact of sleep habits. Review good sleep hygiene tips.    Initial Review & Psychosocial Screening:  Initial Psych Review & Screening - 06/13/23 1434       Initial Review   Current issues with None Identified      Family Dynamics   Good Support System? Yes    Comments He can look to his  wife for support. He does not take any medications for his mood.      Barriers   Psychosocial barriers to participate in program The patient should benefit from training in stress management and relaxation.;There are no identifiable barriers or psychosocial needs.      Screening Interventions   Interventions Encouraged to exercise;To provide support and resources with identified psychosocial needs;Provide feedback about the scores to participant    Expected Outcomes Short Term goal: Utilizing psychosocial counselor, staff and physician to assist with identification of specific Stressors or current issues interfering with healing process. Setting desired goal for each stressor or current issue identified.;Long Term Goal: Stressors or current issues are controlled or eliminated.;Short Term goal: Identification and review with participant of any Quality of Life or Depression concerns found by scoring the questionnaire.;Long Term goal: The participant improves quality of Life and PHQ9 Scores as seen by post scores and/or verbalization of changes             Quality of Life Scores:   Quality of Life - 06/18/23 1541       Quality of Life   Select Quality of Life      Quality of Life Scores   Health/Function Pre 21.13 %    Socioeconomic Pre 23.88 %    Psych/Spiritual Pre 25.14 %    Family Pre 27.6 %    GLOBAL Pre 23.49 %            Scores of 19 and below usually indicate a poorer quality of life in these areas.  A difference of  2-3 points is a clinically meaningful difference.  A difference of 2-3 points in the total score of the Quality of Life Index has been associated with significant improvement in overall quality of life, self-image, physical symptoms, and general health in studies assessing change in quality of life.  PHQ-9: Review Flowsheet       06/18/2023  Depression screen PHQ 2/9  Decreased Interest 2  Down, Depressed, Hopeless 1  PHQ - 2 Score 3  Altered sleeping 2   Tired, decreased energy 1  Change in appetite 2  Feeling bad or failure about yourself  0  Trouble concentrating 1  Moving slowly or fidgety/restless 1  Suicidal thoughts 0  PHQ-9 Score 10  Difficult doing work/chores Somewhat difficult   Interpretation of Total Score  Total Score Depression Severity:  1-4 = Minimal depression, 5-9 = Mild depression, 10-14 = Moderate depression, 15-19 = Moderately severe depression, 20-27 = Severe depression   Psychosocial Evaluation and Intervention:  Psychosocial Evaluation - 06/13/23 1435       Psychosocial Evaluation & Interventions   Interventions Relaxation education;Stress management education;Encouraged to exercise with the program and follow exercise prescription    Comments He can look to his wife for support. He does not take any medications for his mood.    Expected Outcomes Short: Start HeartTrack to help with mood. Long: Maintain a healthy mental state    Continue Psychosocial Services  Follow up required by staff             Psychosocial Re-Evaluation:   Psychosocial Discharge (Final Psychosocial Re-Evaluation):   Vocational Rehabilitation: Provide vocational rehab assistance to qualifying candidates.   Vocational Rehab Evaluation & Intervention:   Education: Education Goals: Education classes will be provided on a variety of topics geared toward better understanding of heart health and risk factor modification. Participant will state understanding/return demonstration of topics presented as noted by education test scores.  Learning Barriers/Preferences:  Learning Barriers/Preferences - 06/13/23 1433       Learning Barriers/Preferences   Learning Barriers None    Learning Preferences None             General Cardiac Education Topics:  AED/CPR: - Group verbal and written instruction with the use of models to demonstrate the basic use of the AED with the basic ABC's of resuscitation.   Anatomy and Cardiac  Procedures: - Group verbal and visual presentation and models provide information about basic cardiac anatomy and function. Reviews the testing methods done to diagnose heart disease and the outcomes of the test results. Describes the treatment choices: Medical Management, Angioplasty, or Coronary Bypass Surgery for treating various heart conditions including Myocardial Infarction, Angina, Valve Disease, and Cardiac Arrhythmias.  Written material given at graduation. Flowsheet Row Cardiac Rehab from 06/18/2023 in [facility] Cardiac and Pulmonary Rehab  Education need identified 06/18/23       Medication Safety: - Group verbal and visual instruction to review commonly prescribed medications for heart and lung disease. Reviews the medication, class of the drug, and side effects. Includes the steps to properly store meds and maintain the prescription regimen.  Written material given at graduation. Flowsheet Row Cardiac Rehab from 06/18/2023 in [facility] Cardiac and Pulmonary Rehab  Education need identified 06/18/23       Intimacy: - Group verbal instruction through game format to discuss how heart and lung disease can affect sexual intimacy. Written material given at graduation..   Know Your Numbers and Heart Failure: - Group verbal and visual instruction to discuss disease risk factors for cardiac and pulmonary disease and treatment options.  Reviews associated critical values for Overweight/Obesity, Hypertension, Cholesterol, and Diabetes.  Discusses basics of heart failure: signs/symptoms and treatments.  Introduces Heart Failure Zone chart for action plan for heart failure.  Written material given at  graduation.   Infection Prevention: - Provides verbal and written material to individual with discussion of infection control including proper hand washing and proper equipment cleaning during exercise session. Flowsheet Row Cardiac Rehab from 06/18/2023 in [facility] Cardiac and Pulmonary Rehab  Date  06/13/23  Educator [facility]  Instruction Review Code 1- Verbalizes Understanding       Falls Prevention: - Provides verbal and written material to individual with discussion of falls prevention and safety. Flowsheet Row Cardiac Rehab from 06/18/2023 in [facility] Cardiac and Pulmonary Rehab  Date 06/13/23  Educator [facility]  Instruction Review Code 1- Verbalizes Understanding       Other: -Provides group and verbal instruction on various topics (see comments)   Knowledge Questionnaire Score:  Knowledge Questionnaire Score - 06/18/23 1541       Knowledge Questionnaire Score   Pre Score 21/26             Core Components/Risk Factors/Patient Goals at Admission:  Personal Goals and Risk Factors at Admission - 06/13/23 1433       Core Components/Risk Factors/Patient Goals on Admission    Weight Management Yes;Weight Maintenance    Intervention Weight Management: Develop a combined nutrition and exercise program designed to reach desired caloric intake, while maintaining appropriate intake of nutrient and fiber, sodium and fats, and appropriate energy expenditure required for the weight goal.;Weight Management: Provide education and appropriate resources to help participant work on and attain dietary goals.;Weight Management/Obesity: Establish reasonable short term and long term weight goals.    Expected Outcomes Short Term: Continue to assess and modify interventions until short term weight is achieved;Weight Maintenance: Understanding of the daily nutrition guidelines, which includes 25-35% calories from fat, 7% or less cal from saturated fats, less than 200mg  cholesterol, less than 1.5gm of sodium, & 5 or more servings of fruits and vegetables daily;Understanding recommendations for meals to include 15-35% energy as protein, 25-35% energy from fat, 35-60% energy from carbohydrates, less than 200mg  of dietary cholesterol, 20-35 gm of total fiber daily;Understanding of distribution of calorie  intake throughout the day with the consumption of 4-5 meals/snacks;Long Term: Adherence to nutrition and physical activity/exercise program aimed toward attainment of established weight goal    Heart Failure Yes    Intervention Provide a combined exercise and nutrition program that is supplemented with education, support and counseling about heart failure. Directed toward relieving symptoms such as shortness of breath, decreased exercise tolerance, and extremity edema.    Expected Outcomes Improve functional capacity of life;Short term: Attendance in program 2-3 days a week with increased exercise capacity. Reported lower sodium intake. Reported increased fruit and vegetable intake. Reports medication compliance.;Short term: Daily weights obtained and reported for increase. Utilizing diuretic protocols set by physician.;Long term: Adoption of self-care skills and reduction of barriers for early signs and symptoms recognition and intervention leading to self-care maintenance.    Hypertension Yes    Intervention Provide education on lifestyle modifcations including regular physical activity/exercise, weight management, moderate sodium restriction and increased consumption of fresh fruit, vegetables, and low fat dairy, alcohol moderation, and smoking cessation.;Monitor prescription use compliance.    Expected Outcomes Short Term: Continued assessment and intervention until BP is < 140/29mm HG in hypertensive participants. < 130/52mm HG in hypertensive participants with diabetes, heart failure or chronic kidney disease.;Long Term: Maintenance of blood pressure at goal levels.    Lipids Yes    Intervention Provide education and support for participant on nutrition & aerobic/resistive exercise along with prescribed medications to achieve LDL 70mg ,  HDL >40mg .    Expected Outcomes Short Term: Participant states understanding of desired cholesterol values and is compliant with medications prescribed. Participant is  following exercise prescription and nutrition guidelines.;Long Term: Cholesterol controlled with medications as prescribed, with individualized exercise RX and with personalized nutrition plan. Value goals: LDL < 70mg , HDL > 40 mg.             Education:Diabetes - Individual verbal and written instruction to review signs/symptoms of diabetes, desired ranges of glucose level fasting, after meals and with exercise. Acknowledge that pre and post exercise glucose checks will be done for 3 sessions at entry of program.   Core Components/Risk Factors/Patient Goals Review:    Core Components/Risk Factors/Patient Goals at Discharge (Final Review):    ITP Comments:  ITP Comments     Row Name 06/13/23 1430 06/18/23 1534         ITP Comments Virtual Visit completed. Patient informed on EP and RD appointment and 6 Minute walk test. Patient also informed of patient health questionnaires on My Chart. Patient Verbalizes understanding. Visit diagnosis can be found in [facility] 02/25/2023. Completed and gym orientation. Initial ITP created and sent for review to Dr. Daniel Nones, Medical Director.               Comments: Initial ITP

## 2023-06-18 NOTE — Patient Instructions (Signed)
Patient Instructions  Patient Details  Name: Shane Peters MRN: [id number] Date of Birth: 1933/10/15 Referring Provider:  Lovenia Shuck, MD  Below are your personal goals for exercise, nutrition, and risk factors. Our goal is to help you stay on track towards obtaining and maintaining these goals. We will be discussing your progress on these goals with you throughout the program.  Initial Exercise Prescription:  Initial Exercise Prescription - 06/18/23 1500       Date of Initial Exercise RX and Referring Provider   Date 06/18/23    Referring Provider Dr. Massie Maroon, MD      Oxygen   Maintain Oxygen Saturation 88% or higher      Treadmill   MPH 1    Grade 0    Minutes 15    METs 1.8      Recumbant Bike   Level 1    RPM 50    Watts 10    Minutes 15    METs 1      NuStep   Level 1    SPM 80    Minutes 15    METs 1      Track   Laps 15    Minutes 15    METs 1.82      Prescription Details   Frequency (times per week) 3    Duration Progress to 30 minutes of continuous aerobic without signs/symptoms of physical distress      Intensity   THRR 40-80% of Max Heartrate 89-118    Ratings of Perceived Exertion 11-13    Perceived Dyspnea 0-4      Progression   Progression Continue to progress workloads to maintain intensity without signs/symptoms of physical distress.      Resistance Training   Training Prescription Yes    Weight 3 lb    Reps 10-15             Exercise Goals: Frequency: Be able to perform aerobic exercise two to three times per week in program working toward 2-5 days per week of home exercise.  Intensity: Work with a perceived exertion of 11 (fairly light) - 15 (hard) while following your exercise prescription.  We will make changes to your prescription with you as you progress through the program.   Duration: Be able to do 30 to 45 minutes of continuous aerobic exercise in addition to a 5 minute warm-up and a 5 minute cool-down routine.    Nutrition Goals: Your personal nutrition goals will be established when you do your nutrition analysis with the dietician.  The following are general nutrition guidelines to follow: Cholesterol < 200mg /day Sodium < 1500mg /day Fiber: Men over 50 yrs - 30 grams per day  Personal Goals:  Personal Goals and Risk Factors at Admission - 06/13/23 1433       Core Components/Risk Factors/Patient Goals on Admission    Weight Management Yes;Weight Maintenance    Intervention Weight Management: Develop a combined nutrition and exercise program designed to reach desired caloric intake, while maintaining appropriate intake of nutrient and fiber, sodium and fats, and appropriate energy expenditure required for the weight goal.;Weight Management: Provide education and appropriate resources to help participant work on and attain dietary goals.;Weight Management/Obesity: Establish reasonable short term and long term weight goals.    Expected Outcomes Short Term: Continue to assess and modify interventions until short term weight is achieved;Weight Maintenance: Understanding of the daily nutrition guidelines, which includes 25-35% calories from fat, 7% or less cal from saturated  fats, less than 200mg  cholesterol, less than 1.5gm of sodium, & 5 or more servings of fruits and vegetables daily;Understanding recommendations for meals to include 15-35% energy as protein, 25-35% energy from fat, 35-60% energy from carbohydrates, less than 200mg  of dietary cholesterol, 20-35 gm of total fiber daily;Understanding of distribution of calorie intake throughout the day with the consumption of 4-5 meals/snacks;Long Term: Adherence to nutrition and physical activity/exercise program aimed toward attainment of established weight goal    Heart Failure Yes    Intervention Provide a combined exercise and nutrition program that is supplemented with education, support and counseling about heart failure. Directed toward relieving  symptoms such as shortness of breath, decreased exercise tolerance, and extremity edema.    Expected Outcomes Improve functional capacity of life;Short term: Attendance in program 2-3 days a week with increased exercise capacity. Reported lower sodium intake. Reported increased fruit and vegetable intake. Reports medication compliance.;Short term: Daily weights obtained and reported for increase. Utilizing diuretic protocols set by physician.;Long term: Adoption of self-care skills and reduction of barriers for early signs and symptoms recognition and intervention leading to self-care maintenance.    Hypertension Yes    Intervention Provide education on lifestyle modifcations including regular physical activity/exercise, weight management, moderate sodium restriction and increased consumption of fresh fruit, vegetables, and low fat dairy, alcohol moderation, and smoking cessation.;Monitor prescription use compliance.    Expected Outcomes Short Term: Continued assessment and intervention until BP is < 140/53mm HG in hypertensive participants. < 130/20mm HG in hypertensive participants with diabetes, heart failure or chronic kidney disease.;Long Term: Maintenance of blood pressure at goal levels.    Lipids Yes    Intervention Provide education and support for participant on nutrition & aerobic/resistive exercise along with prescribed medications to achieve LDL 70mg , HDL >40mg .    Expected Outcomes Short Term: Participant states understanding of desired cholesterol values and is compliant with medications prescribed. Participant is following exercise prescription and nutrition guidelines.;Long Term: Cholesterol controlled with medications as prescribed, with individualized exercise RX and with personalized nutrition plan. Value goals: LDL < 70mg , HDL > 40 mg.            Exercise Goals and Review:  Exercise Goals     Row Name 06/18/23 1547             Exercise Goals   Increase Physical Activity  Yes       Intervention Provide advice, education, support and counseling about physical activity/exercise needs.;Develop an individualized exercise prescription for aerobic and resistive training based on initial evaluation findings, risk stratification, comorbidities and participant's personal goals.       Expected Outcomes Short Term: Attend rehab on a regular basis to increase amount of physical activity.;Long Term: Add in home exercise to make exercise part of routine and to increase amount of physical activity.;Long Term: Exercising regularly at least 3-5 days a week.       Increase Strength and Stamina Yes       Intervention Provide advice, education, support and counseling about physical activity/exercise needs.;Develop an individualized exercise prescription for aerobic and resistive training based on initial evaluation findings, risk stratification, comorbidities and participant's personal goals.       Expected Outcomes Short Term: Increase workloads from initial exercise prescription for resistance, speed, and METs.;Short Term: Perform resistance training exercises routinely during rehab and add in resistance training at home;Long Term: Improve cardiorespiratory fitness, muscular endurance and strength as measured by increased METs and functional capacity ( )  Able to understand and use rate of perceived exertion (RPE) scale Yes       Intervention Provide education and explanation on how to use RPE scale       Expected Outcomes Short Term: Able to use RPE daily in rehab to express subjective intensity level;Long Term:  Able to use RPE to guide intensity level when exercising independently       Able to understand and use Dyspnea scale Yes       Intervention Provide education and explanation on how to use Dyspnea scale       Expected Outcomes Short Term: Able to use Dyspnea scale daily in rehab to express subjective sense of shortness of breath during exertion;Long Term: Able to use  Dyspnea scale to guide intensity level when exercising independently       Knowledge and understanding of Target Heart Rate Range (THRR) Yes       Intervention Provide education and explanation of THRR including how the numbers were predicted and where they are located for reference       Expected Outcomes Short Term: Able to state/look up THRR;Long Term: Able to use THRR to govern intensity when exercising independently;Short Term: Able to use daily as guideline for intensity in rehab       Able to check pulse independently Yes       Intervention Provide education and demonstration on how to check pulse in carotid and radial arteries.;Review the importance of being able to check your own pulse for safety during independent exercise       Expected Outcomes Long Term: Able to check pulse independently and accurately;Short Term: Able to explain why pulse checking is important during independent exercise       Understanding of Exercise Prescription Yes       Intervention Provide education, explanation, and written materials on patient's individual exercise prescription       Expected Outcomes Short Term: Able to explain program exercise prescription;Long Term: Able to explain home exercise prescription to exercise independently

## 2023-07-02 ENCOUNTER — Encounter: Payer: Medicare Other | Attending: Cardiology | Admitting: *Deleted

## 2023-07-02 DIAGNOSIS — I252 Old myocardial infarction: Secondary | ICD-10-CM | POA: Insufficient documentation

## 2023-07-02 DIAGNOSIS — I214 Non-ST elevation (NSTEMI) myocardial infarction: Secondary | ICD-10-CM | POA: Diagnosis present

## 2023-07-02 DIAGNOSIS — Z5189 Encounter for other specified aftercare: Secondary | ICD-10-CM | POA: Diagnosis not present

## 2023-07-02 NOTE — Progress Notes (Signed)
 Daily Session Note  Patient Details  Name: Shane Peters MRN: [id number] Date of Birth: [birth year] Referring Provider:   Flowsheet Row Cardiac Rehab from 06/18/2023 in [facility] Cardiac and Pulmonary Rehab  Referring Provider Dr. Margery Ruth, MD       Encounter Date: 07/02/2023  Check In:  Session Check In - 07/02/23 1152       Check-In   Supervising physician immediately available to respond to emergencies See telemetry face sheet for immediately available ER MD    Location [facility]-Cardiac & Pulmonary Rehab    Staff Present Rollene Paterson, MS, Exercise Physiologist;Maxon Burnell HECKLE, , Exercise Physiologist;Kelly Dyane, BS, ACSM CEP, Exercise Physiologist;Ravi Tuccillo Claudene, RN, ADN    Virtual Visit No    Medication changes reported     No    Fall or balance concerns reported    No    Warm-up and Cool-down Performed on first and last piece of equipment    Resistance Training Performed Yes    VAD Patient? No    PAD/SET Patient? No      Pain Assessment   Currently in Pain? No/denies                Social History   Tobacco Use  Smoking Status Never  Smokeless Tobacco Never    Goals Met:  Independence with exercise equipment Exercise tolerated well No report of concerns or symptoms today Strength training completed today  Goals Unmet:  Not Applicable  Comments: First full day of exercise!  Patient was oriented to gym and equipment including functions, settings, policies, and procedures.  Patient's individual exercise prescription and treatment plan were reviewed.  All starting workloads were established based on the results of the 6 minute walk test done at initial orientation visit.  The plan for exercise progression was also introduced and progression will be customized based on patient's performance and goals.     Dr. Oneil Pinal is Medical Director for [facility] Cardiac Rehabilitation.  Dr. Fuad Aleskerov is Medical Director for [facility] Pulmonary Rehabilitation.

## 2023-07-04 DIAGNOSIS — I252 Old myocardial infarction: Secondary | ICD-10-CM | POA: Diagnosis not present

## 2023-07-04 DIAGNOSIS — I214 Non-ST elevation (NSTEMI) myocardial infarction: Secondary | ICD-10-CM

## 2023-07-04 NOTE — Progress Notes (Signed)
 Daily Session Note  Patient Details  Name: Shane Peters MRN: [id number] Date of Birth: 1933/08/21 Referring Provider:   Flowsheet Row Cardiac Rehab from 06/18/2023 in [facility] Cardiac and Pulmonary Rehab  Referring Provider Dr. Margery Ruth, MD       Encounter Date: 07/04/2023  Check In:  Session Check In - 07/04/23 1112       Check-In   Supervising physician immediately available to respond to emergencies See telemetry face sheet for immediately available ER MD    Location [facility]-Cardiac & Pulmonary Rehab    Staff Present Othel Durand, RN, BSN, CCRP;Joseph Del Muerto, RCP,RRT,BSRT;Kelly [place], BS, ACSM CEP, Exercise Physiologist;Alter Moss Claudene, RN, ADN    Virtual Visit No    Medication changes reported     No    Fall or balance concerns reported    No    Warm-up and Cool-down Performed on first and last piece of equipment    Resistance Training Performed Yes    VAD Patient? No    PAD/SET Patient? No      Pain Assessment   Currently in Pain? No/denies                Social History   Tobacco Use  Smoking Status Never  Smokeless Tobacco Never    Goals Met:  Independence with exercise equipment Exercise tolerated well No report of concerns or symptoms today Strength training completed today  Goals Unmet:  Not Applicable  Comments: Pt able to follow exercise prescription today without complaint.  Will continue to monitor for progression.    Dr. Oneil Pinal is Medical Director for [facility] Cardiac Rehabilitation.  Dr. Fuad Aleskerov is Medical Director for [facility] Pulmonary Rehabilitation.

## 2023-07-09 ENCOUNTER — Encounter: Payer: Medicare Other | Admitting: *Deleted

## 2023-07-09 DIAGNOSIS — I252 Old myocardial infarction: Secondary | ICD-10-CM | POA: Diagnosis not present

## 2023-07-09 DIAGNOSIS — I214 Non-ST elevation (NSTEMI) myocardial infarction: Secondary | ICD-10-CM

## 2023-07-09 NOTE — Progress Notes (Signed)
 Daily Session Note  Patient Details  Name: Savannah Erbe MRN: [id number] Date of Birth: 1933-06-30 Referring Provider:   Flowsheet Row Cardiac Rehab from 06/18/2023 in [facility] Cardiac and Pulmonary Rehab  Referring Provider Dr. Margery Ruth, MD       Encounter Date: 07/09/2023  Check In:  Session Check In - 07/09/23 1057       Check-In   Supervising physician immediately available to respond to emergencies See telemetry face sheet for immediately available ER MD    Location [facility]-Cardiac & Pulmonary Rehab    Staff Present Maxon Conetta BS, , Exercise Physiologist;Vuk Skillern Dyane, BS, ACSM CEP, Exercise Physiologist;Margaret Best, MS, Exercise Physiologist;Susanne Bice, RN, BSN, CCRP;Other   Burnard Davenport, RN   Virtual Visit No    Medication changes reported     No    Fall or balance concerns reported    No    Warm-up and Cool-down Performed on first and last piece of equipment    Resistance Training Performed Yes    VAD Patient? No    PAD/SET Patient? No      Pain Assessment   Currently in Pain? No/denies                Social History   Tobacco Use  Smoking Status Never  Smokeless Tobacco Never    Goals Met:  Independence with exercise equipment Exercise tolerated well No report of concerns or symptoms today Strength training completed today  Goals Unmet:  Not Applicable  Comments: Pt able to follow exercise prescription today without complaint.  Will continue to monitor for progression.    Dr. Oneil Pinal is Medical Director for [facility] Cardiac Rehabilitation.  Dr. Fuad Aleskerov is Medical Director for [facility] Pulmonary Rehabilitation.

## 2023-07-11 ENCOUNTER — Encounter: Payer: Medicare Other | Admitting: *Deleted

## 2023-07-11 ENCOUNTER — Encounter: Payer: Self-pay | Admitting: *Deleted

## 2023-07-11 DIAGNOSIS — I214 Non-ST elevation (NSTEMI) myocardial infarction: Secondary | ICD-10-CM

## 2023-07-11 DIAGNOSIS — I252 Old myocardial infarction: Secondary | ICD-10-CM | POA: Diagnosis not present

## 2023-07-11 NOTE — Progress Notes (Signed)
 Daily Session Note  Patient Details  Name: Shane Peters MRN: [id number] Date of Birth: 1934/03/21 Referring Provider:   Flowsheet Row Cardiac Rehab from 06/18/2023 in [facility] Cardiac and Pulmonary Rehab  Referring Provider Dr. Teddie Favre, MD       Encounter Date: 07/11/2023  Check In:  Session Check In - 07/11/23 1141       Check-In   Supervising physician immediately available to respond to emergencies See telemetry face sheet for immediately available ER MD    Location [facility]-Cardiac & Pulmonary Rehab    Staff Present Maud Sorenson, RN, BSN, CCRP;Meredith Manson Seitz RN,BSN;Joseph Tres Pinos RCP,RRT,BSRT    Virtual Visit No    Medication changes reported     No    Fall or balance concerns reported    No    Warm-up and Cool-down Performed on first and last piece of equipment    Resistance Training Performed Yes    VAD Patient? No    PAD/SET Patient? No      Pain Assessment   Currently in Pain? No/denies                Social History   Tobacco Use  Smoking Status Never  Smokeless Tobacco Never    Goals Met:  Independence with exercise equipment Exercise tolerated well No report of concerns or symptoms today  Goals Unmet:  Not Applicable  Comments: Pt able to follow exercise prescription today without complaint.  Will continue to monitor for progression.    Dr. Firman Hughes is Medical Director for [facility] Cardiac Rehabilitation.  Dr. Fuad Aleskerov is Medical Director for [facility] Pulmonary Rehabilitation.

## 2023-07-11 NOTE — Progress Notes (Signed)
 Cardiac Individual Treatment Plan  Patient Details  Name: Shane Peters MRN: [id number] Date of Birth: Mar 22, 1934 Referring Provider:   Flowsheet Row Cardiac Rehab from 06/18/2023 in [facility] Cardiac and Pulmonary Rehab  Referring Provider Dr. Teddie Favre, MD       Initial Encounter Date:  Flowsheet Row Cardiac Rehab from 06/18/2023 in [facility] Cardiac and Pulmonary Rehab  Date 06/18/23       Visit Diagnosis: NSTEMI (non-ST elevated myocardial infarction) [facility])  Patient's Home Medications on Admission:  Current Outpatient Medications:    albuterol  (VENTOLIN  HFA) 108 (90 Base) MCG/ACT inhaler, Inhale 1-2 puffs into the lungs every 6 (six) hours as needed for wheezing or shortness of breath. (Patient not taking: Reported on 06/13/2023), Disp: 18 g, Rfl: 0   amiodarone (PACERONE) 200 MG tablet, Take 200 mg by mouth daily. (Patient not taking: Reported on 06/13/2023), Disp: , Rfl:    apixaban (ELIQUIS) 5 MG TABS tablet, Take by mouth., Disp: , Rfl:    aspirin  EC 81 MG tablet, Take 81 mg by mouth at bedtime. (Patient not taking: Reported on 06/13/2023), Disp: , Rfl:    aspirin  EC 81 MG tablet, Take 1 tablet by mouth daily., Disp: , Rfl:    atenolol (TENORMIN) 100 MG tablet, Take 100 mg by mouth daily. (Patient not taking: Reported on 06/13/2023), Disp: , Rfl:    atorvastatin  (LIPITOR) 80 MG tablet, Take 80 mg by mouth at bedtime.  (Patient not taking: Reported on 06/13/2023), Disp: , Rfl:    atorvastatin  (LIPITOR) 80 MG tablet, Take by mouth. (Patient not taking: Reported on 06/13/2023), Disp: , Rfl:    azelastine (ASTELIN) 0.1 % nasal spray, Place 2 sprays into both nostrils 2 (two) times daily. (Patient not taking: Reported on 06/13/2023), Disp: , Rfl:    Cinnamon  500 MG capsule, Take 500 mg by mouth at bedtime. (Patient not taking: Reported on 06/13/2023), Disp: , Rfl:    clopidogrel (PLAVIX) 75 MG tablet, Take 75 mg by mouth daily., Disp: , Rfl:    diphenhydramine-acetaminophen  (TYLENOL  PM)  25-500 MG TABS tablet, Take 2 tablets by mouth at bedtime., Disp: , Rfl:    doxycycline  (VIBRAMYCIN ) 100 MG capsule, Take 1 capsule (100 mg total) by mouth 2 (two) times daily. (Patient not taking: Reported on 06/13/2023), Disp: 20 capsule, Rfl: 0   empagliflozin (JARDIANCE) 10 MG TABS tablet, Take by mouth., Disp: , Rfl:    esomeprazole (NEXIUM) 20 MG capsule, Take 20 mg by mouth daily before breakfast. (Patient not taking: Reported on 06/13/2023), Disp: , Rfl:    esomeprazole (NEXIUM) 20 MG capsule, Take by mouth. (Patient not taking: Reported on 06/13/2023), Disp: , Rfl:    esomeprazole (NEXIUM) 40 MG capsule, Take by mouth., Disp: , Rfl:    ezetimibe (ZETIA) 10 MG tablet, Take 10 mg by mouth daily., Disp: , Rfl:    fluticasone (FLONASE) 50 MCG/ACT nasal spray, 1 spray into each nostril daily. Frequency:QD   Dosage:50   MCG  Instructions:  Note:Dose: , Disp: , Rfl:    furosemide (LASIX) 40 MG tablet, Take by mouth., Disp: , Rfl:    gabapentin (NEURONTIN) 100 MG capsule, Take by mouth., Disp: , Rfl:    hydrochlorothiazide (MICROZIDE) 12.5 MG capsule, Take 12.5 mg by mouth daily., Disp: , Rfl:    isosorbide mononitrate (IMDUR) 60 MG 24 hr tablet, Take by mouth., Disp: , Rfl:    levothyroxine  (SYNTHROID ) 75 MCG tablet, Take by mouth. (Patient not taking: Reported on 06/13/2023), Disp: , Rfl:  levothyroxine  (SYNTHROID , LEVOTHROID) 75 MCG tablet, Take 75 mcg by mouth daily before breakfast., Disp: , Rfl:    losartan (COZAAR) 25 MG tablet, Take 1 tablet by mouth at bedtime., Disp: , Rfl:    metoprolol succinate (TOPROL-XL) 50 MG 24 hr tablet, Take by mouth., Disp: , Rfl:    montelukast (SINGULAIR) 10 MG tablet, Take 10 mg by mouth daily. (Patient not taking: Reported on 06/13/2023), Disp: , Rfl:    nitroGLYCERIN (NITROSTAT) 0.4 MG SL tablet, Instructions:  sublingual as needed for chest pain, Disp: , Rfl:    omega-3 acid ethyl esters (LOVAZA ) 1 g capsule, Take 1 g by mouth at bedtime., Disp: ,  Rfl:    omega-3 acid ethyl esters (LOVAZA ) 1 g capsule, Take 2 capsules by mouth daily. (Patient not taking: Reported on 06/13/2023), Disp: , Rfl:    ranolazine (RANEXA) 1000 MG SR tablet, Take 1,000 mg by mouth 2 (two) times daily., Disp: , Rfl:    ranolazine (RANEXA) 500 MG 12 hr tablet, Take 500 mg by mouth 2 (two) times daily. (Patient not taking: Reported on 06/13/2023), Disp: , Rfl:    rosuvastatin (CRESTOR) 20 MG tablet, Take 20 mg by mouth at bedtime., Disp: , Rfl:    spironolactone (ALDACTONE) 25 MG tablet, Take 0.5 tablets by mouth daily., Disp: , Rfl:   Past Medical History: Past Medical History:  Diagnosis Date   Coronary artery disease    Hyperlipidemia    Hypertension     Tobacco Use: Social History   Tobacco Use  Smoking Status Never  Smokeless Tobacco Never    Labs: Review Flowsheet        No data to display           Exercise Target Goals: Exercise Program Goal: Individual exercise prescription set using results from initial 6 min walk test and THRR while considering  patient's activity barriers and safety.   Exercise Prescription Goal: Initial exercise prescription builds to 30-45 minutes a day of aerobic activity, 2-3 days per week.  Home exercise guidelines will be given to patient during program as part of exercise prescription that the participant will acknowledge.   Education: Aerobic Exercise: - Group verbal and visual presentation on the components of exercise prescription. Introduces F.I.T.T principle from ACSM for exercise prescriptions.  Reviews F.I.T.T. principles of aerobic exercise including progression. Written material given at graduation. Flowsheet Row Cardiac Rehab from 06/18/2023 in [facility] Cardiac and Pulmonary Rehab  Education need identified 06/18/23       Education: Resistance Exercise: - Group verbal and visual presentation on the components of exercise prescription. Introduces F.I.T.T principle from ACSM for exercise  prescriptions  Reviews F.I.T.T. principles of resistance exercise including progression. Written material given at graduation.    Education: Exercise & Equipment Safety: - Individual verbal instruction and demonstration of equipment use and safety with use of the equipment. Flowsheet Row Cardiac Rehab from 06/18/2023 in [facility] Cardiac and Pulmonary Rehab  Date 06/13/23  Educator [facility]  Instruction Review Code 1- Verbalizes Understanding       Education: Exercise Physiology & General Exercise Guidelines: - Group verbal and written instruction with models to review the exercise physiology of the cardiovascular system and associated critical values. Provides general exercise guidelines with specific guidelines to those with heart or lung disease.    Education: Flexibility, Balance, Mind/Body Relaxation: - Group verbal and visual presentation with interactive activity on the components of exercise prescription. Introduces F.I.T.T principle from ACSM for exercise prescriptions. Reviews F.I.T.T. principles of  flexibility and balance exercise training including progression. Also discusses the mind body connection.  Reviews various relaxation techniques to help reduce and manage stress (i.e. Deep breathing, progressive muscle relaxation, and visualization). Balance handout provided to take home. Written material given at graduation.   Activity Barriers & Risk Stratification:  Activity Barriers & Cardiac Risk Stratification - 06/18/23 1546       Activity Barriers & Cardiac Risk Stratification   Activity Barriers Back Problems;Deconditioning;Muscular Weakness;Balance Concerns    Cardiac Risk Stratification High             6 Minute Walk:  6 Minute Walk     Row Name 06/18/23 1544         6 Minute Walk   Phase Initial     Distance 660 feet     Walk Time 6 minutes     # of Rest Breaks 0     MPH 1.25     METS 1     RPE 9     Perceived Dyspnea  0     VO2 Peak 3.11     Symptoms Yes  (comment)     Comments hip fatigue     Resting HR 60 bpm     Resting BP 110/58     Resting Oxygen Saturation  98 %     Exercise Oxygen Saturation  during 6 min walk 95 %     Max Ex. HR 96 bpm     Max Ex. BP 126/60     2 Minute Post BP 114/54              Oxygen Initial Assessment:   Oxygen Re-Evaluation:   Oxygen Discharge (Final Oxygen Re-Evaluation):   Initial Exercise Prescription:  Initial Exercise Prescription - 06/18/23 1500       Date of Initial Exercise RX and Referring Provider   Date 06/18/23    Referring Provider Dr. Teddie Favre, MD      Oxygen   Maintain Oxygen Saturation 88% or higher      Treadmill   MPH 1    Grade 0    Minutes 15    METs 1.8      Recumbant Bike   Level 1    RPM 50    Watts 10    Minutes 15    METs 1      NuStep   Level 1    SPM 80    Minutes 15    METs 1      Track   Laps 15    Minutes 15    METs 1.82      Prescription Details   Frequency (times per week) 3    Duration Progress to 30 minutes of continuous aerobic without signs/symptoms of physical distress      Intensity   THRR 40-80% of Max Heartrate 89-118    Ratings of Perceived Exertion 11-13    Perceived Dyspnea 0-4      Progression   Progression Continue to progress workloads to maintain intensity without signs/symptoms of physical distress.      Resistance Training   Training Prescription Yes    Weight 3 lb    Reps 10-15             Perform Capillary Blood Glucose checks as needed.  Exercise Prescription Changes:   Exercise Prescription Changes     Row Name 06/18/23 1500             Response to Exercise  Blood Pressure (Admit) 110/58       Blood Pressure (Exercise) 126/60       Blood Pressure (Exit) 114/54       Heart Rate (Admit) 60 bpm       Heart Rate (Exercise) 96 bpm       Heart Rate (Exit) 65 bpm       Oxygen Saturation (Admit) 98 %       Oxygen Saturation (Exercise) 95 %       Rating of Perceived Exertion (Exercise) 9        Perceived Dyspnea (Exercise) 0       Symptoms hip fatigue       Comments Results                Exercise Comments:   Exercise Comments     Row Name 07/02/23 1153           Exercise Comments First full day of exercise!  Patient was oriented to gym and equipment including functions, settings, policies, and procedures.  Patient's individual exercise prescription and treatment plan were reviewed.  All starting workloads were established based on the results of the 6 minute walk test done at initial orientation visit.  The plan for exercise progression was also introduced and progression will be customized based on patient's performance and goals.                Exercise Goals and Review:   Exercise Goals     Row Name 06/18/23 1547             Exercise Goals   Increase Physical Activity Yes       Intervention Provide advice, education, support and counseling about physical activity/exercise needs.;Develop an individualized exercise prescription for aerobic and resistive training based on initial evaluation findings, risk stratification, comorbidities and participant's personal goals.       Expected Outcomes Short Term: Attend rehab on a regular basis to increase amount of physical activity.;Long Term: Add in home exercise to make exercise part of routine and to increase amount of physical activity.;Long Term: Exercising regularly at least 3-5 days a week.       Increase Strength and Stamina Yes       Intervention Provide advice, education, support and counseling about physical activity/exercise needs.;Develop an individualized exercise prescription for aerobic and resistive training based on initial evaluation findings, risk stratification, comorbidities and participant's personal goals.       Expected Outcomes Short Term: Increase workloads from initial exercise prescription for resistance, speed, and METs.;Short Term: Perform resistance training exercises routinely  during rehab and add in resistance training at home;Long Term: Improve cardiorespiratory fitness, muscular endurance and strength as measured by increased METs and functional capacity ( )       Able to understand and use rate of perceived exertion (RPE) scale Yes       Intervention Provide education and explanation on how to use RPE scale       Expected Outcomes Short Term: Able to use RPE daily in rehab to express subjective intensity level;Long Term:  Able to use RPE to guide intensity level when exercising independently       Able to understand and use Dyspnea scale Yes       Intervention Provide education and explanation on how to use Dyspnea scale       Expected Outcomes Short Term: Able to use Dyspnea scale daily in rehab to express subjective sense of shortness of breath during  exertion;Long Term: Able to use Dyspnea scale to guide intensity level when exercising independently       Knowledge and understanding of Target Heart Rate Range (THRR) Yes       Intervention Provide education and explanation of THRR including how the numbers were predicted and where they are located for reference       Expected Outcomes Short Term: Able to state/look up THRR;Long Term: Able to use THRR to govern intensity when exercising independently;Short Term: Able to use daily as guideline for intensity in rehab       Able to check pulse independently Yes       Intervention Provide education and demonstration on how to check pulse in carotid and radial arteries.;Review the importance of being able to check your own pulse for safety during independent exercise       Expected Outcomes Long Term: Able to check pulse independently and accurately;Short Term: Able to explain why pulse checking is important during independent exercise       Understanding of Exercise Prescription Yes       Intervention Provide education, explanation, and written materials on patient's individual exercise prescription       Expected  Outcomes Short Term: Able to explain program exercise prescription;Long Term: Able to explain home exercise prescription to exercise independently                Exercise Goals Re-Evaluation :  Exercise Goals Re-Evaluation     Row Name 07/02/23 1154 07/09/23 1115           Exercise Goal Re-Evaluation   Exercise Goals Review Able to understand and use rate of perceived exertion (RPE) scale;Able to understand and use Dyspnea scale;Knowledge and understanding of Target Heart Rate Range (THRR);Understanding of Exercise Prescription Increase Physical Activity;Increase Strength and Stamina;Understanding of Exercise Prescription      Comments Reviewed RPE and dyspnea scale, THR and program prescription with pt today.  Pt voiced understanding and was given a copy of goals to take home. Doyel reports he is off to a good start in his exercise routine. He feels he is off to a good start in rehab. Patient has no concerns and exercise progression guidelines were discussed with patient for when he feels ready to increase his workloads.      Expected Outcomes Short: Use RPE daily to regulate intensity. Long: Follow program prescription in THR. Short: continue to attend rehab consistently and report to staff when exercise starts to feel easier so we can help him with progression. Long: become independent with exercise routine.               Discharge Exercise Prescription (Final Exercise Prescription Changes):  Exercise Prescription Changes - 06/18/23 1500       Response to Exercise   Blood Pressure (Admit) 110/58    Blood Pressure (Exercise) 126/60    Blood Pressure (Exit) 114/54    Heart Rate (Admit) 60 bpm    Heart Rate (Exercise) 96 bpm    Heart Rate (Exit) 65 bpm    Oxygen Saturation (Admit) 98 %    Oxygen Saturation (Exercise) 95 %    Rating of Perceived Exertion (Exercise) 9    Perceived Dyspnea (Exercise) 0    Symptoms hip fatigue    Comments Results              Nutrition:  Target Goals: Understanding of nutrition guidelines, daily intake of sodium 1500mg , cholesterol 200mg , calories 30% from fat and 7% or  less from saturated fats, daily to have 5 or more servings of fruits and vegetables.  Education: All About Nutrition: -Group instruction provided by verbal, written material, interactive activities, discussions, models, and posters to present general guidelines for heart healthy nutrition including fat, fiber, MyPlate, the role of sodium in heart healthy nutrition, utilization of the nutrition label, and utilization of this knowledge for meal planning. Follow up email sent as well. Written material given at graduation. Flowsheet Row Cardiac Rehab from 06/18/2023 in [facility] Cardiac and Pulmonary Rehab  Education need identified 06/18/23       Biometrics:  Pre Biometrics - 06/18/23 1548       Pre Biometrics   Height 5\' 9"  (1.753 m)    Weight 177 lb 14.4 oz (80.7 kg)    Waist Circumference 37 inches    Hip Circumference 39 inches    Waist to Hip Ratio 0.95 %    BMI (Calculated) 26.26    Single Leg Stand 2.01 seconds              Nutrition Therapy Plan and Nutrition Goals:  Nutrition Therapy & Goals - 06/18/23 1542       Nutrition Therapy   RD appointment deferred Yes      Intervention Plan   Intervention Prescribe, educate and counsel regarding individualized specific dietary modifications aiming towards targeted core components such as weight, hypertension, lipid management, diabetes, heart failure and other comorbidities.    Expected Outcomes Short Term Goal: Understand basic principles of dietary content, such as calories, fat, sodium, cholesterol and nutrients.;Short Term Goal: A plan has been developed with personal nutrition goals set during dietitian appointment.;Long Term Goal: Adherence to prescribed nutrition plan.             Nutrition Assessments:  MEDIFICTS Score Key: >=70 Need to make dietary changes   40-70 Heart Healthy Diet <= 40 Therapeutic Level Cholesterol Diet  Flowsheet Row Cardiac Rehab from 06/18/2023 in [facility] Cardiac and Pulmonary Rehab  Picture Your Plate Total Score on Admission 45      Picture Your Plate Scores: <16 Unhealthy dietary pattern with much room for improvement. 41-50 Dietary pattern unlikely to meet recommendations for good health and room for improvement. 51-60 More healthful dietary pattern, with some room for improvement.  >60 Healthy dietary pattern, although there may be some specific behaviors that could be improved.    Nutrition Goals Re-Evaluation:  Nutrition Goals Re-Evaluation     Row Name 07/09/23 1122             Goals   Comment Continues to defer RD apt.                Nutrition Goals Discharge (Final Nutrition Goals Re-Evaluation):  Nutrition Goals Re-Evaluation - 07/09/23 1122       Goals   Comment Continues to defer RD apt.             Psychosocial: Target Goals: Acknowledge presence or absence of significant depression and/or stress, maximize coping skills, provide positive support system. Participant is able to verbalize types and ability to use techniques and skills needed for reducing stress and depression.   Education: Stress, Anxiety, and Depression - Group verbal and visual presentation to define topics covered.  Reviews how body is impacted by stress, anxiety, and depression.  Also discusses healthy ways to reduce stress and to treat/manage anxiety and depression.  Written material given at graduation.   Education: Sleep Hygiene -Provides group verbal and written instruction about how  sleep can affect your health.  Define sleep hygiene, discuss sleep cycles and impact of sleep habits. Review good sleep hygiene tips.    Initial Review & Psychosocial Screening:  Initial Psych Review & Screening - 06/13/23 1434       Initial Review   Current issues with None Identified      Family Dynamics   Good  Support System? Yes    Comments He can look to his wife for support. He does not take any medications for his mood.      Barriers   Psychosocial barriers to participate in program The patient should benefit from training in stress management and relaxation.;There are no identifiable barriers or psychosocial needs.      Screening Interventions   Interventions Encouraged to exercise;To provide support and resources with identified psychosocial needs;Provide feedback about the scores to participant    Expected Outcomes Short Term goal: Utilizing psychosocial counselor, staff and physician to assist with identification of specific Stressors or current issues interfering with healing process. Setting desired goal for each stressor or current issue identified.;Long Term Goal: Stressors or current issues are controlled or eliminated.;Short Term goal: Identification and review with participant of any Quality of Life or Depression concerns found by scoring the questionnaire.;Long Term goal: The participant improves quality of Life and PHQ9 Scores as seen by post scores and/or verbalization of changes             Quality of Life Scores:   Quality of Life - 06/18/23 1541       Quality of Life   Select Quality of Life      Quality of Life Scores   Health/Function Pre 21.13 %    Socioeconomic Pre 23.88 %    Psych/Spiritual Pre 25.14 %    Family Pre 27.6 %    GLOBAL Pre 23.49 %            Scores of 19 and below usually indicate a poorer quality of life in these areas.  A difference of  2-3 points is a clinically meaningful difference.  A difference of 2-3 points in the total score of the Quality of Life Index has been associated with significant improvement in overall quality of life, self-image, physical symptoms, and general health in studies assessing change in quality of life.  PHQ-9: Review Flowsheet       07/09/2023 06/18/2023  Depression screen PHQ 2/9  Decreased Interest 0 2   Down, Depressed, Hopeless 0 1  PHQ - 2 Score 0 3  Altered sleeping 1 2  Tired, decreased energy 2 1  Change in appetite 1 2  Feeling bad or failure about yourself  0 0  Trouble concentrating 0 1  Moving slowly or fidgety/restless 2 1  Suicidal thoughts 0 0  PHQ-9 Score 6 10  Difficult doing work/chores Not difficult at all Somewhat difficult   Interpretation of Total Score  Total Score Depression Severity:  1-4 = Minimal depression, 5-9 = Mild depression, 10-14 = Moderate depression, 15-19 = Moderately severe depression, 20-27 = Severe depression   Psychosocial Evaluation and Intervention:  Psychosocial Evaluation - 06/13/23 1435       Psychosocial Evaluation & Interventions   Interventions Relaxation education;Stress management education;Encouraged to exercise with the program and follow exercise prescription    Comments He can look to his wife for support. He does not take any medications for his mood.    Expected Outcomes Short: Start HeartTrack to help with mood. Long: Maintain a healthy  mental state    Continue Psychosocial Services  Follow up required by staff             Psychosocial Re-Evaluation:  Psychosocial Re-Evaluation     Row Name 07/09/23 1120             Psychosocial Re-Evaluation   Current issues with None Identified       Comments Patient PHQ was re-evalusted. His score went down from a 10 to a 6. He reports no stress, sleep, or mental health concerns.       Expected Outcomes Short: continue to attend cardiac rehab consistently to gain strength and for mental health benefit. Long: maintain good mental health habits.       Interventions Encouraged to attend Cardiac Rehabilitation for the exercise       Continue Psychosocial Services  Follow up required by staff                Psychosocial Discharge (Final Psychosocial Re-Evaluation):  Psychosocial Re-Evaluation - 07/09/23 1120       Psychosocial Re-Evaluation   Current issues with None  Identified    Comments Patient PHQ was re-evalusted. His score went down from a 10 to a 6. He reports no stress, sleep, or mental health concerns.    Expected Outcomes Short: continue to attend cardiac rehab consistently to gain strength and for mental health benefit. Long: maintain good mental health habits.    Interventions Encouraged to attend Cardiac Rehabilitation for the exercise    Continue Psychosocial Services  Follow up required by staff             Vocational Rehabilitation: Provide vocational rehab assistance to qualifying candidates.   Vocational Rehab Evaluation & Intervention:   Education: Education Goals: Education classes will be provided on a variety of topics geared toward better understanding of heart health and risk factor modification. Participant will state understanding/return demonstration of topics presented as noted by education test scores.  Learning Barriers/Preferences:  Learning Barriers/Preferences - 06/13/23 1433       Learning Barriers/Preferences   Learning Barriers None    Learning Preferences None             General Cardiac Education Topics:  AED/CPR: - Group verbal and written instruction with the use of models to demonstrate the basic use of the AED with the basic ABC's of resuscitation.   Anatomy and Cardiac Procedures: - Group verbal and visual presentation and models provide information about basic cardiac anatomy and function. Reviews the testing methods done to diagnose heart disease and the outcomes of the test results. Describes the treatment choices: Medical Management, Angioplasty, or Coronary Bypass Surgery for treating various heart conditions including Myocardial Infarction, Angina, Valve Disease, and Cardiac Arrhythmias.  Written material given at graduation. Flowsheet Row Cardiac Rehab from 06/18/2023 in [facility] Cardiac and Pulmonary Rehab  Education need identified 06/18/23       Medication Safety: - Group verbal  and visual instruction to review commonly prescribed medications for heart and lung disease. Reviews the medication, class of the drug, and side effects. Includes the steps to properly store meds and maintain the prescription regimen.  Written material given at graduation. Flowsheet Row Cardiac Rehab from 06/18/2023 in [facility] Cardiac and Pulmonary Rehab  Education need identified 06/18/23       Intimacy: - Group verbal instruction through game format to discuss how heart and lung disease can affect sexual intimacy. Written material given at graduation..   Know Your Numbers and Heart  Failure: - Group verbal and visual instruction to discuss disease risk factors for cardiac and pulmonary disease and treatment options.  Reviews associated critical values for Overweight/Obesity, Hypertension, Cholesterol, and Diabetes.  Discusses basics of heart failure: signs/symptoms and treatments.  Introduces Heart Failure Zone chart for action plan for heart failure.  Written material given at graduation.   Infection Prevention: - Provides verbal and written material to individual with discussion of infection control including proper hand washing and proper equipment cleaning during exercise session. Flowsheet Row Cardiac Rehab from 06/18/2023 in [facility] Cardiac and Pulmonary Rehab  Date 06/13/23  Educator [facility]  Instruction Review Code 1- Verbalizes Understanding       Falls Prevention: - Provides verbal and written material to individual with discussion of falls prevention and safety. Flowsheet Row Cardiac Rehab from 06/18/2023 in [facility] Cardiac and Pulmonary Rehab  Date 06/13/23  Educator [facility]  Instruction Review Code 1- Verbalizes Understanding       Other: -Provides group and verbal instruction on various topics (see comments)   Knowledge Questionnaire Score:  Knowledge Questionnaire Score - 06/18/23 1541       Knowledge Questionnaire Score   Pre Score 21/26             Core  Components/Risk Factors/Patient Goals at Admission:  Personal Goals and Risk Factors at Admission - 06/13/23 1433       Core Components/Risk Factors/Patient Goals on Admission    Weight Management Yes;Weight Maintenance    Intervention Weight Management: Develop a combined nutrition and exercise program designed to reach desired caloric intake, while maintaining appropriate intake of nutrient and fiber, sodium and fats, and appropriate energy expenditure required for the weight goal.;Weight Management: Provide education and appropriate resources to help participant work on and attain dietary goals.;Weight Management/Obesity: Establish reasonable short term and long term weight goals.    Expected Outcomes Short Term: Continue to assess and modify interventions until short term weight is achieved;Weight Maintenance: Understanding of the daily nutrition guidelines, which includes 25-35% calories from fat, 7% or less cal from saturated fats, less than 200mg  cholesterol, less than 1.5gm of sodium, & 5 or more servings of fruits and vegetables daily;Understanding recommendations for meals to include 15-35% energy as protein, 25-35% energy from fat, 35-60% energy from carbohydrates, less than 200mg  of dietary cholesterol, 20-35 gm of total fiber daily;Understanding of distribution of calorie intake throughout the day with the consumption of 4-5 meals/snacks;Long Term: Adherence to nutrition and physical activity/exercise program aimed toward attainment of established weight goal    Heart Failure Yes    Intervention Provide a combined exercise and nutrition program that is supplemented with education, support and counseling about heart failure. Directed toward relieving symptoms such as shortness of breath, decreased exercise tolerance, and extremity edema.    Expected Outcomes Improve functional capacity of life;Short term: Attendance in program 2-3 days a week with increased exercise capacity. Reported lower  sodium intake. Reported increased fruit and vegetable intake. Reports medication compliance.;Short term: Daily weights obtained and reported for increase. Utilizing diuretic protocols set by physician.;Long term: Adoption of self-care skills and reduction of barriers for early signs and symptoms recognition and intervention leading to self-care maintenance.    Hypertension Yes    Intervention Provide education on lifestyle modifcations including regular physical activity/exercise, weight management, moderate sodium restriction and increased consumption of fresh fruit, vegetables, and low fat dairy, alcohol moderation, and smoking cessation.;Monitor prescription use compliance.    Expected Outcomes Short Term: Continued assessment and intervention until BP  is < 140/45mm HG in hypertensive participants. < 130/60mm HG in hypertensive participants with diabetes, heart failure or chronic kidney disease.;Long Term: Maintenance of blood pressure at goal levels.    Lipids Yes    Intervention Provide education and support for participant on nutrition & aerobic/resistive exercise along with prescribed medications to achieve LDL 70mg , HDL >40mg .    Expected Outcomes Short Term: Participant states understanding of desired cholesterol values and is compliant with medications prescribed. Participant is following exercise prescription and nutrition guidelines.;Long Term: Cholesterol controlled with medications as prescribed, with individualized exercise RX and with personalized nutrition plan. Value goals: LDL < 70mg , HDL > 40 mg.             Education:Diabetes - Individual verbal and written instruction to review signs/symptoms of diabetes, desired ranges of glucose level fasting, after meals and with exercise. Acknowledge that pre and post exercise glucose checks will be done for 3 sessions at entry of program.   Core Components/Risk Factors/Patient Goals Review:   Goals and Risk Factor Review     Row Name  07/09/23 1121             Core Components/Risk Factors/Patient Goals Review   Personal Goals Review Hypertension       Review Patient reports taking BP at home. He has no current concerns about managing his BP.       Expected Outcomes Short: continue to check BP at home. Long: control cardiac risk factors.                Core Components/Risk Factors/Patient Goals at Discharge (Final Review):   Goals and Risk Factor Review - 07/09/23 1121       Core Components/Risk Factors/Patient Goals Review   Personal Goals Review Hypertension    Review Patient reports taking BP at home. He has no current concerns about managing his BP.    Expected Outcomes Short: continue to check BP at home. Long: control cardiac risk factors.             ITP Comments:  ITP Comments     Row Name 06/13/23 1430 06/18/23 1534 07/02/23 1153 07/11/23 1254     ITP Comments Virtual Visit completed. Patient informed on EP and RD appointment and 6 Minute walk test. Patient also informed of patient health questionnaires on My Chart. Patient Verbalizes understanding. Visit diagnosis can be found in [facility] 02/25/2023. Completed and gym orientation. Initial ITP created and sent for review to Dr. Eddy Goodell, Medical Director. First full day of exercise!  Patient was oriented to gym and equipment including functions, settings, policies, and procedures.  Patient's individual exercise prescription and treatment plan were reviewed.  All starting workloads were established based on the results of the 6 minute walk test done at initial orientation visit.  The plan for exercise progression was also introduced and progression will be customized based on patient's performance and goals. 30 Day review completed. Medical Director ITP review done, changes made as directed, and signed approval by Medical Director.    new to program             Comments:

## 2023-07-13 ENCOUNTER — Encounter: Payer: Medicare Other | Admitting: *Deleted

## 2023-07-13 DIAGNOSIS — I214 Non-ST elevation (NSTEMI) myocardial infarction: Secondary | ICD-10-CM

## 2023-07-13 DIAGNOSIS — I252 Old myocardial infarction: Secondary | ICD-10-CM | POA: Diagnosis not present

## 2023-07-13 NOTE — Progress Notes (Signed)
Daily Session Note  Patient Details  Name: Ikeem Schoenig MRN: [id number] Date of Birth: 03-02-1934 Referring Provider:   Flowsheet Row Cardiac Rehab from 06/18/2023 in [facility] Cardiac and Pulmonary Rehab  Referring Provider Dr. Massie Maroon, MD       Encounter Date: 07/13/2023  Check In:  Session Check In - 07/13/23 1226       Check-In   Supervising physician immediately available to respond to emergencies See telemetry face sheet for immediately available ER MD    Location [facility]-Cardiac & Pulmonary Rehab    Staff Present Bess Kinds RN,BSN;Joseph [facility] [place], [place], Exercise Physiologist    Virtual Visit No    Medication changes reported     No    Fall or balance concerns reported    No    Warm-up and Cool-down Performed on first and last piece of equipment    Resistance Training Performed Yes    VAD Patient? No      Pain Assessment   Currently in Pain? No/denies                Social History   Tobacco Use  Smoking Status Never  Smokeless Tobacco Never    Goals Met:  Independence with exercise equipment Exercise tolerated well No report of concerns or symptoms today Strength training completed today  Goals Unmet:  Not Applicable  Comments: Pt able to follow exercise prescription today without complaint.  Will continue to monitor for progression.    Dr. Bethann Punches is Medical Director for [facility] Cardiac Rehabilitation.  Dr. Vida Rigger is Medical Director for [facility] Pulmonary Rehabilitation.

## 2023-07-16 ENCOUNTER — Encounter: Payer: Medicare Other | Admitting: *Deleted

## 2023-07-16 DIAGNOSIS — I252 Old myocardial infarction: Secondary | ICD-10-CM | POA: Diagnosis not present

## 2023-07-16 DIAGNOSIS — I214 Non-ST elevation (NSTEMI) myocardial infarction: Secondary | ICD-10-CM

## 2023-07-16 NOTE — Progress Notes (Signed)
Daily Session Note  Patient Details  Name: Shane Peters MRN: [id number] Date of Birth: May 16, 1934 Referring Provider:   Flowsheet Row Cardiac Rehab from 06/18/2023 in [facility] Cardiac and Pulmonary Rehab  Referring Provider Dr. Massie Maroon, MD       Encounter Date: 07/16/2023  Check In:  Session Check In - 07/16/23 1124       Check-In   Supervising physician immediately available to respond to emergencies See telemetry face sheet for immediately available ER MD    Location [facility]-Cardiac & Pulmonary Rehab    Staff Present Maxon Conetta BS, Exercise Physiologist;Margaret Best, MS, Exercise Physiologist;Leela Vanbrocklin Jewel Baize RN,BSN;Kelly Madilyn Fireman BS, ACSM CEP, Exercise Physiologist    Virtual Visit No    Medication changes reported     No    Fall or balance concerns reported    No    Warm-up and Cool-down Performed on first and last piece of equipment    Resistance Training Performed Yes    VAD Patient? No    PAD/SET Patient? No      Pain Assessment   Currently in Pain? No/denies                Social History   Tobacco Use  Smoking Status Never  Smokeless Tobacco Never    Goals Met:  Independence with exercise equipment Exercise tolerated well No report of concerns or symptoms today Strength training completed today  Goals Unmet:  Not Applicable  Comments: Pt able to follow exercise prescription today without complaint.  Will continue to monitor for progression.    Dr. Bethann Punches is Medical Director for [facility] Cardiac Rehabilitation.  Dr. Vida Rigger is Medical Director for [facility] Pulmonary Rehabilitation.

## 2023-07-20 ENCOUNTER — Encounter: Payer: Medicare Other | Admitting: *Deleted

## 2023-07-20 DIAGNOSIS — I252 Old myocardial infarction: Secondary | ICD-10-CM | POA: Diagnosis not present

## 2023-07-20 DIAGNOSIS — I214 Non-ST elevation (NSTEMI) myocardial infarction: Secondary | ICD-10-CM

## 2023-07-20 NOTE — Progress Notes (Signed)
Daily Session Note  Patient Details  Name: Shane Peters MRN: [id number] Date of Birth: Oct 28, 1933 Referring Provider:   Flowsheet Row Cardiac Rehab from 06/18/2023 in [facility] Cardiac and Pulmonary Rehab  Referring Provider Dr. Massie Maroon, MD       Encounter Date: 07/20/2023  Check In:  Session Check In - 07/20/23 1134       Check-In   Supervising physician immediately available to respond to emergencies See telemetry face sheet for immediately available ER MD    Location [facility]-Cardiac & Pulmonary Rehab    Staff Present Elige Ko RCP,RRT,BSRT;Cora Collum, RN, BSN, CCRP;Noah Tickle, BS, Exercise Physiologist    Virtual Visit No    Medication changes reported     No    Fall or balance concerns reported    No    Warm-up and Cool-down Performed on first and last piece of equipment    Resistance Training Performed Yes    VAD Patient? No    PAD/SET Patient? No      Pain Assessment   Currently in Pain? No/denies                Social History   Tobacco Use  Smoking Status Never  Smokeless Tobacco Never    Goals Met:  Independence with exercise equipment Exercise tolerated well No report of concerns or symptoms today  Goals Unmet:  Not Applicable  Comments: Pt able to follow exercise prescription today without complaint.  Will continue to monitor for progression.    Dr. Bethann Punches is Medical Director for [facility] Cardiac Rehabilitation.  Dr. Vida Rigger is Medical Director for [facility] Pulmonary Rehabilitation.

## 2023-07-23 ENCOUNTER — Encounter: Payer: Medicare Other | Admitting: *Deleted

## 2023-07-23 DIAGNOSIS — I214 Non-ST elevation (NSTEMI) myocardial infarction: Secondary | ICD-10-CM

## 2023-07-23 DIAGNOSIS — I252 Old myocardial infarction: Secondary | ICD-10-CM | POA: Diagnosis not present

## 2023-07-23 NOTE — Progress Notes (Signed)
Daily Session Note  Patient Details  Name: Shane Peters MRN: [id number] Date of Birth: 02/24/1934 Referring Provider:   Flowsheet Row Cardiac Rehab from 06/18/2023 in [facility] Cardiac and Pulmonary Rehab  Referring Provider Dr. Massie Maroon, MD       Encounter Date: 07/23/2023  Check In:  Session Check In - 07/23/23 1202       Check-In   Supervising physician immediately available to respond to emergencies See telemetry face sheet for immediately available ER MD    Location [facility]-Cardiac & Pulmonary Rehab    Staff Present Rory Percy, MS, Exercise Physiologist;Neiman Roots, RN, BSN, CCRP;Maxon Conetta BS, Exercise Physiologist;Kelly [employer], ACSM CEP, Exercise Physiologist    Virtual Visit No    Medication changes reported     No    Fall or balance concerns reported    No    Warm-up and Cool-down Performed on first and last piece of equipment    Resistance Training Performed Yes    VAD Patient? No    PAD/SET Patient? No      Pain Assessment   Currently in Pain? No/denies                Social History   Tobacco Use  Smoking Status Never  Smokeless Tobacco Never    Goals Met:  Independence with exercise equipment Exercise tolerated well No report of concerns or symptoms today  Goals Unmet:  Not Applicable  Comments: Pt able to follow exercise prescription today without complaint.  Will continue to monitor for progression.    Dr. Bethann Punches is Medical Director for [facility] Cardiac Rehabilitation.  Dr. Vida Rigger is Medical Director for [facility] Pulmonary Rehabilitation.

## 2023-07-25 DIAGNOSIS — I214 Non-ST elevation (NSTEMI) myocardial infarction: Secondary | ICD-10-CM

## 2023-07-25 NOTE — Progress Notes (Signed)
Daily Session Note  Patient Details  Name: Shane Peters MRN: [id number] Date of Birth: [birth year] Referring Provider:   Flowsheet Row Cardiac Rehab from 06/18/2023 in [facility] Cardiac and Pulmonary Rehab  Referring Provider Dr. Massie Maroon, MD       Encounter Date: 07/25/2023  Check In:  Session Check In - 07/25/23 1128       Check-In   Supervising physician immediately available to respond to emergencies See telemetry face sheet for immediately available ER MD    Location [facility]-Cardiac & Pulmonary Rehab    Staff Present Kelton Pillar RN,BSN,MPA;Margaret Best, MS, Exercise Physiologist;Meredith Jewel Baize RN,BSN;Joseph Reino Kent RCP,RRT,BSRT    Virtual Visit No    Medication changes reported     No    Fall or balance concerns reported    No    Warm-up and Cool-down Performed on first and last piece of equipment    Resistance Training Performed Yes    VAD Patient? No    PAD/SET Patient? No      Pain Assessment   Currently in Pain? No/denies                Social History   Tobacco Use  Smoking Status Never  Smokeless Tobacco Never    Goals Met:  Independence with exercise equipment Exercise tolerated well No report of concerns or symptoms today Strength training completed today  Goals Unmet:  Not Applicable  Comments: Pt able to follow exercise prescription today without complaint.  Will continue to monitor for progression.    Dr. Bethann Punches is Medical Director for [facility] Cardiac Rehabilitation.  Dr. Vida Rigger is Medical Director for [facility] Pulmonary Rehabilitation.

## 2023-07-27 ENCOUNTER — Encounter: Payer: Medicare Other | Admitting: *Deleted

## 2023-07-27 DIAGNOSIS — I252 Old myocardial infarction: Secondary | ICD-10-CM | POA: Diagnosis not present

## 2023-07-27 DIAGNOSIS — I214 Non-ST elevation (NSTEMI) myocardial infarction: Secondary | ICD-10-CM

## 2023-07-27 NOTE — Progress Notes (Signed)
Daily Session Note  Patient Details  Name: Shane Peters MRN: [id number] Date of Birth: 05/13/1934 Referring Provider:   Flowsheet Row Cardiac Rehab from 06/18/2023 in [facility] Cardiac and Pulmonary Rehab  Referring Provider Dr. Massie Maroon, MD       Encounter Date: 07/27/2023  Check In:  Session Check In - 07/27/23 1127       Check-In   Supervising physician immediately available to respond to emergencies See telemetry face sheet for immediately available ER MD    Location [facility]-Cardiac & Pulmonary Rehab    Staff Present Cora Collum, RN, BSN, CCRP;Joseph Hood RCP,RRT,BSRT;Noah Tickle, [place], Exercise Physiologist    Virtual Visit No    Medication changes reported     No    Fall or balance concerns reported    No    Warm-up and Cool-down Performed on first and last piece of equipment    Resistance Training Performed Yes    VAD Patient? No    PAD/SET Patient? No      Pain Assessment   Currently in Pain? No/denies                Social History   Tobacco Use  Smoking Status Never  Smokeless Tobacco Never    Goals Met:  Independence with exercise equipment Exercise tolerated well No report of concerns or symptoms today  Goals Unmet:  Not Applicable  Comments: Pt able to follow exercise prescription today without complaint.  Will continue to monitor for progression.    Dr. Bethann Punches is Medical Director for [facility] Cardiac Rehabilitation.  Dr. Vida Rigger is Medical Director for [facility] Pulmonary Rehabilitation.

## 2023-07-30 ENCOUNTER — Encounter: Payer: Medicare Other | Attending: Cardiology

## 2023-07-30 DIAGNOSIS — I252 Old myocardial infarction: Secondary | ICD-10-CM | POA: Diagnosis not present

## 2023-07-30 DIAGNOSIS — I214 Non-ST elevation (NSTEMI) myocardial infarction: Secondary | ICD-10-CM

## 2023-07-30 DIAGNOSIS — Z5189 Encounter for other specified aftercare: Secondary | ICD-10-CM | POA: Diagnosis present

## 2023-07-30 NOTE — Progress Notes (Signed)
Daily Session Note  Patient Details  Name: Shane Peters MRN: [id number] Date of Birth: 20-Oct-1933 Referring Provider:   Flowsheet Row Cardiac Rehab from 06/18/2023 in [facility] Cardiac and Pulmonary Rehab  Referring Provider Dr. Massie Maroon, MD       Encounter Date: 07/30/2023  Check In:  Session Check In - 07/30/23 1122       Check-In   Supervising physician immediately available to respond to emergencies See telemetry face sheet for immediately available ER MD    Location [facility]-Cardiac & Pulmonary Rehab    Staff Present Kelton Pillar RN,BSN,MPA;Margaret Best, MS, Exercise Physiologist;Maxon Conetta BS, Exercise Physiologist;Monterrius Cardosa Cloretta Ned, ACSM CEP, Exercise Physiologist    Virtual Visit No    Medication changes reported     No    Fall or balance concerns reported    No    Warm-up and Cool-down Performed on first and last piece of equipment    Resistance Training Performed Yes    VAD Patient? No    PAD/SET Patient? No      Pain Assessment   Currently in Pain? No/denies                Social History   Tobacco Use  Smoking Status Never  Smokeless Tobacco Never    Goals Met:  Independence with exercise equipment Exercise tolerated well No report of concerns or symptoms today Strength training completed today  Goals Unmet:  Not Applicable  Comments: Pt able to follow exercise prescription today without complaint.  Will continue to monitor for progression.    Dr. Bethann Punches is Medical Director for [facility] Cardiac Rehabilitation.  Dr. Vida Rigger is Medical Director for [facility] Pulmonary Rehabilitation.

## 2023-08-01 ENCOUNTER — Encounter: Payer: Medicare Other | Admitting: *Deleted

## 2023-08-01 DIAGNOSIS — I214 Non-ST elevation (NSTEMI) myocardial infarction: Secondary | ICD-10-CM

## 2023-08-01 DIAGNOSIS — Z5189 Encounter for other specified aftercare: Secondary | ICD-10-CM | POA: Diagnosis not present

## 2023-08-01 NOTE — Progress Notes (Signed)
 Daily Session Note  Patient Details  Name: Shane Peters MRN: [id number] Date of Birth: 06/01/1934 Referring Provider:   Flowsheet Row Cardiac Rehab from 06/18/2023 in [facility] Cardiac and Pulmonary Rehab  Referring Provider Dr. Margery Ruth, MD       Encounter Date: 08/01/2023  Check In:  Session Check In - 08/01/23 1134       Check-In   Supervising physician immediately available to respond to emergencies See telemetry face sheet for immediately available ER MD    Location [facility]-Cardiac & Pulmonary Rehab    Staff Present Rollene Paterson, MS, Exercise Physiologist;Mattisen Pohlmann, RN, BSN, CCRP;Joseph Hood RCP,RRT,BSRT;Meredith Craven RN,BSN    Virtual Visit No    Medication changes reported     No    Fall or balance concerns reported    No    Warm-up and Cool-down Performed on first and last piece of equipment    Resistance Training Performed Yes    VAD Patient? No    PAD/SET Patient? No      Pain Assessment   Currently in Pain? No/denies                Social History   Tobacco Use  Smoking Status Never  Smokeless Tobacco Never    Goals Met:  Independence with exercise equipment Exercise tolerated well Personal goals reviewed No report of concerns or symptoms today  Goals Unmet:  Not Applicable  Comments: Pt able to follow exercise prescription today without complaint.  Will continue to monitor for progression.    Dr. Oneil Pinal is Medical Director for [facility] Cardiac Rehabilitation.  Dr. Fuad Aleskerov is Medical Director for [facility] Pulmonary Rehabilitation.

## 2023-08-03 ENCOUNTER — Encounter: Payer: Medicare Other | Admitting: *Deleted

## 2023-08-03 DIAGNOSIS — Z5189 Encounter for other specified aftercare: Secondary | ICD-10-CM | POA: Diagnosis not present

## 2023-08-03 DIAGNOSIS — I214 Non-ST elevation (NSTEMI) myocardial infarction: Secondary | ICD-10-CM

## 2023-08-03 NOTE — Progress Notes (Signed)
 Daily Session Note  Patient Details  Name: Shane Peters MRN: [id number] Date of Birth: Oct 28, 1933 Referring Provider:   Flowsheet Row Cardiac Rehab from 06/18/2023 in [facility] Cardiac and Pulmonary Rehab  Referring Provider Dr. Margery Ruth, MD       Encounter Date: 08/03/2023  Check In:  Session Check In - 08/03/23 1142       Check-In   Supervising physician immediately available to respond to emergencies See telemetry face sheet for immediately available ER MD    Location [facility]-Cardiac & Pulmonary Rehab    Staff Present Othel Durand, RN, BSN, CCRP;Noah Tickle, BS, Exercise Physiologist;Joseph Hood RCP,RRT,BSRT    Virtual Visit No    Medication changes reported     No    Fall or balance concerns reported    No    Warm-up and Cool-down Performed on first and last piece of equipment    Resistance Training Performed Yes    VAD Patient? No    PAD/SET Patient? No      Pain Assessment   Currently in Pain? No/denies                Social History   Tobacco Use  Smoking Status Never  Smokeless Tobacco Never    Goals Met:  Independence with exercise equipment Exercise tolerated well No report of concerns or symptoms today  Goals Unmet:  Not Applicable  Comments: Pt able to follow exercise prescription today without complaint.  Will continue to monitor for progression.    Dr. Oneil Pinal is Medical Director for [facility] Cardiac Rehabilitation.  Dr. Fuad Aleskerov is Medical Director for [facility] Pulmonary Rehabilitation.

## 2023-08-06 ENCOUNTER — Encounter: Payer: Medicare Other | Admitting: *Deleted

## 2023-08-06 DIAGNOSIS — I214 Non-ST elevation (NSTEMI) myocardial infarction: Secondary | ICD-10-CM

## 2023-08-06 DIAGNOSIS — Z5189 Encounter for other specified aftercare: Secondary | ICD-10-CM | POA: Diagnosis not present

## 2023-08-06 NOTE — Progress Notes (Signed)
 Daily Session Note  Patient Details  Name: Shane Peters MRN: [id number] Date of Birth: 1934/04/12 Referring Provider:   Flowsheet Row Cardiac Rehab from 06/18/2023 in [facility]  Referring Provider Dr. Teddie Favre, MD       Encounter Date: 08/06/2023  Check In:  Session Check In - 08/06/23 1410       Check-In   Supervising physician immediately available to respond to emergencies See telemetry face sheet for immediately available ER MD    Location [facility]-Cardiac & Pulmonary Rehab    Staff Present Maud Sorenson, RN, BSN, CCRP;Margaret Best, MS, Exercise Physiologist;Maxon Beauford Bounds, Exercise Physiologist;Meredith Manson Seitz RN,BSN;Kelly Sabra Cramp BS, ACSM CEP, Exercise Physiologist    Virtual Visit No    Medication changes reported     No    Fall or balance concerns reported    No    Warm-up and Cool-down Performed on first and last piece of equipment    Resistance Training Performed Yes    VAD Patient? No    PAD/SET Patient? No      Pain Assessment   Currently in Pain? No/denies                Social History   Tobacco Use  Smoking Status Never  Smokeless Tobacco Never    Goals Met:  Independence with exercise equipment Exercise tolerated well No report of concerns or symptoms today  Goals Unmet:  Not Applicable  Comments: Pt able to follow exercise prescription today without complaint.  Will continue to monitor for progression.    Dr. Firman Hughes is Medical Director for [facility] Cardiac Rehabilitation.  Dr. Fuad Aleskerov is Medical Director for [facility] Pulmonary Rehabilitation.

## 2023-08-08 ENCOUNTER — Encounter: Payer: Self-pay | Admitting: *Deleted

## 2023-08-08 DIAGNOSIS — I214 Non-ST elevation (NSTEMI) myocardial infarction: Secondary | ICD-10-CM

## 2023-08-08 NOTE — Progress Notes (Signed)
Cardiac Individual Treatment Plan  Patient Details  Name: Shane Peters MRN: [id number] Date of Birth: April 25, 1934 Referring Provider:   Flowsheet Row Cardiac Rehab from 06/18/2023 in [facility] Cardiac and Pulmonary Rehab  Referring Provider Dr. Massie Maroon, MD       Initial Encounter Date:  Flowsheet Row Cardiac Rehab from 06/18/2023 in [facility] Cardiac and Pulmonary Rehab  Date 06/18/23       Visit Diagnosis: NSTEMI (non-ST elevated myocardial infarction) [facility])  Patient's Home Medications on Admission:  Current Outpatient Medications:    albuterol (VENTOLIN HFA) 108 (90 Base) MCG/ACT inhaler, Inhale 1-2 puffs into the lungs every 6 (six) hours as needed for wheezing or shortness of breath. (Patient not taking: Reported on 06/13/2023), Disp: 18 g, Rfl: 0   amiodarone (PACERONE) 200 MG tablet, Take 200 mg by mouth daily. (Patient not taking: Reported on 06/13/2023), Disp: , Rfl:    apixaban (ELIQUIS) 5 MG TABS tablet, Take by mouth., Disp: , Rfl:    aspirin EC 81 MG tablet, Take 81 mg by mouth at bedtime. (Patient not taking: Reported on 06/13/2023), Disp: , Rfl:    aspirin EC 81 MG tablet, Take 1 tablet by mouth daily., Disp: , Rfl:    atenolol (TENORMIN) 100 MG tablet, Take 100 mg by mouth daily. (Patient not taking: Reported on 06/13/2023), Disp: , Rfl:    atorvastatin (LIPITOR) 80 MG tablet, Take 80 mg by mouth at bedtime.  (Patient not taking: Reported on 06/13/2023), Disp: , Rfl:    atorvastatin (LIPITOR) 80 MG tablet, Take by mouth. (Patient not taking: Reported on 06/13/2023), Disp: , Rfl:    azelastine (ASTELIN) 0.1 % nasal spray, Place 2 sprays into both nostrils 2 (two) times daily. (Patient not taking: Reported on 06/13/2023), Disp: , Rfl:    Cinnamon 500 MG capsule, Take 500 mg by mouth at bedtime. (Patient not taking: Reported on 06/13/2023), Disp: , Rfl:    clopidogrel (PLAVIX) 75 MG tablet, Take 75 mg by mouth daily., Disp: , Rfl:    diphenhydramine-acetaminophen (TYLENOL PM)  25-500 MG TABS tablet, Take 2 tablets by mouth at bedtime., Disp: , Rfl:    doxycycline (VIBRAMYCIN) 100 MG capsule, Take 1 capsule (100 mg total) by mouth 2 (two) times daily. (Patient not taking: Reported on 06/13/2023), Disp: 20 capsule, Rfl: 0   empagliflozin (JARDIANCE) 10 MG TABS tablet, Take by mouth., Disp: , Rfl:    esomeprazole (NEXIUM) 20 MG capsule, Take 20 mg by mouth daily before breakfast. (Patient not taking: Reported on 06/13/2023), Disp: , Rfl:    esomeprazole (NEXIUM) 20 MG capsule, Take by mouth. (Patient not taking: Reported on 06/13/2023), Disp: , Rfl:    esomeprazole (NEXIUM) 40 MG capsule, Take by mouth., Disp: , Rfl:    ezetimibe (ZETIA) 10 MG tablet, Take 10 mg by mouth daily., Disp: , Rfl:    fluticasone (FLONASE) 50 MCG/ACT nasal spray, 1 spray into each nostril daily. Frequency:QD   Dosage:50   MCG  Instructions:  Note:Dose: , Disp: , Rfl:    furosemide (LASIX) 40 MG tablet, Take by mouth., Disp: , Rfl:    gabapentin (NEURONTIN) 100 MG capsule, Take by mouth., Disp: , Rfl:    hydrochlorothiazide (MICROZIDE) 12.5 MG capsule, Take 12.5 mg by mouth daily., Disp: , Rfl:    isosorbide mononitrate (IMDUR) 60 MG 24 hr tablet, Take by mouth., Disp: , Rfl:    levothyroxine (SYNTHROID) 75 MCG tablet, Take by mouth. (Patient not taking: Reported on 06/13/2023), Disp: , Rfl:  levothyroxine (SYNTHROID, LEVOTHROID) 75 MCG tablet, Take 75 mcg by mouth daily before breakfast., Disp: , Rfl:    losartan (COZAAR) 25 MG tablet, Take 1 tablet by mouth at bedtime., Disp: , Rfl:    metoprolol succinate (TOPROL-XL) 50 MG 24 hr tablet, Take by mouth., Disp: , Rfl:    montelukast (SINGULAIR) 10 MG tablet, Take 10 mg by mouth daily. (Patient not taking: Reported on 06/13/2023), Disp: , Rfl:    nitroGLYCERIN (NITROSTAT) 0.4 MG SL tablet, Instructions:  sublingual as needed for chest pain, Disp: , Rfl:    omega-3 acid ethyl esters (LOVAZA) 1 g capsule, Take 1 g by mouth at bedtime., Disp: ,  Rfl:    omega-3 acid ethyl esters (LOVAZA) 1 g capsule, Take 2 capsules by mouth daily. (Patient not taking: Reported on 06/13/2023), Disp: , Rfl:    ranolazine (RANEXA) 1000 MG SR tablet, Take 1,000 mg by mouth 2 (two) times daily., Disp: , Rfl:    ranolazine (RANEXA) 500 MG 12 hr tablet, Take 500 mg by mouth 2 (two) times daily. (Patient not taking: Reported on 06/13/2023), Disp: , Rfl:    rosuvastatin (CRESTOR) 20 MG tablet, Take 20 mg by mouth at bedtime., Disp: , Rfl:    spironolactone (ALDACTONE) 25 MG tablet, Take 0.5 tablets by mouth daily., Disp: , Rfl:   Past Medical History: Past Medical History:  Diagnosis Date   Coronary artery disease    Hyperlipidemia    Hypertension     Tobacco Use: Social History   Tobacco Use  Smoking Status Never  Smokeless Tobacco Never    Labs: Review Flowsheet        No data to display           Exercise Target Goals: Exercise Program Goal: Individual exercise prescription set using results from initial 6 min walk test and THRR while considering  patient's activity barriers and safety.   Exercise Prescription Goal: Initial exercise prescription builds to 30-45 minutes a day of aerobic activity, 2-3 days per week.  Home exercise guidelines will be given to patient during program as part of exercise prescription that the participant will acknowledge.   Education: Aerobic Exercise: - Group verbal and visual presentation on the components of exercise prescription. Introduces F.I.T.T principle from ACSM for exercise prescriptions.  Reviews F.I.T.T. principles of aerobic exercise including progression. Written material given at graduation. Flowsheet Row Cardiac Rehab from 06/18/2023 in [facility] Cardiac and Pulmonary Rehab  Education need identified 06/18/23       Education: Resistance Exercise: - Group verbal and visual presentation on the components of exercise prescription. Introduces F.I.T.T principle from ACSM for exercise  prescriptions  Reviews F.I.T.T. principles of resistance exercise including progression. Written material given at graduation.    Education: Exercise & Equipment Safety: - Individual verbal instruction and demonstration of equipment use and safety with use of the equipment. Flowsheet Row Cardiac Rehab from 06/18/2023 in [facility] Cardiac and Pulmonary Rehab  Date 06/13/23  Educator [facility]  Instruction Review Code 1- Verbalizes Understanding       Education: Exercise Physiology & General Exercise Guidelines: - Group verbal and written instruction with models to review the exercise physiology of the cardiovascular system and associated critical values. Provides general exercise guidelines with specific guidelines to those with heart or lung disease.    Education: Flexibility, Balance, Mind/Body Relaxation: - Group verbal and visual presentation with interactive activity on the components of exercise prescription. Introduces F.I.T.T principle from ACSM for exercise prescriptions. Reviews F.I.T.T. principles of  flexibility and balance exercise training including progression. Also discusses the mind body connection.  Reviews various relaxation techniques to help reduce and manage stress (i.e. Deep breathing, progressive muscle relaxation, and visualization). Balance handout provided to take home. Written material given at graduation.   Activity Barriers & Risk Stratification:  Activity Barriers & Cardiac Risk Stratification - 06/18/23 1546       Activity Barriers & Cardiac Risk Stratification   Activity Barriers Back Problems;Deconditioning;Muscular Weakness;Balance Concerns    Cardiac Risk Stratification High             6 Minute Walk:  6 Minute Walk     Row Name 06/18/23 1544         6 Minute Walk   Phase Initial     Distance 660 feet     Walk Time 6 minutes     # of Rest Breaks 0     MPH 1.25     METS 1     RPE 9     Perceived Dyspnea  0     VO2 Peak 3.11     Symptoms Yes  (comment)     Comments hip fatigue     Resting HR 60 bpm     Resting BP 110/58     Resting Oxygen Saturation  98 %     Exercise Oxygen Saturation  during 6 min walk 95 %     Max Ex. HR 96 bpm     Max Ex. BP 126/60     2 Minute Post BP 114/54              Oxygen Initial Assessment:   Oxygen Re-Evaluation:   Oxygen Discharge (Final Oxygen Re-Evaluation):   Initial Exercise Prescription:  Initial Exercise Prescription - 06/18/23 1500       Date of Initial Exercise RX and Referring Provider   Date 06/18/23    Referring Provider Dr. Massie Maroon, MD      Oxygen   Maintain Oxygen Saturation 88% or higher      Treadmill   MPH 1    Grade 0    Minutes 15    METs 1.8      Recumbant Bike   Level 1    RPM 50    Watts 10    Minutes 15    METs 1      NuStep   Level 1    SPM 80    Minutes 15    METs 1      Track   Laps 15    Minutes 15    METs 1.82      Prescription Details   Frequency (times per week) 3    Duration Progress to 30 minutes of continuous aerobic without signs/symptoms of physical distress      Intensity   THRR 40-80% of Max Heartrate 89-118    Ratings of Perceived Exertion 11-13    Perceived Dyspnea 0-4      Progression   Progression Continue to progress workloads to maintain intensity without signs/symptoms of physical distress.      Resistance Training   Training Prescription Yes    Weight 3 lb    Reps 10-15             Perform Capillary Blood Glucose checks as needed.  Exercise Prescription Changes:   Exercise Prescription Changes     Row Name 06/18/23 1500 07/18/23 1300 08/01/23 1100         Response to Exercise  Blood Pressure (Admit) 110/58 106/52 118/60     Blood Pressure (Exercise) 126/60 130/68 110/58     Blood Pressure (Exit) 114/54 100/56 98/60     Heart Rate (Admit) 60 bpm 64 bpm 68 bpm     Heart Rate (Exercise) 96 bpm 98 bpm 90 bpm     Heart Rate (Exit) 65 bpm 67 bpm 57 bpm     Oxygen Saturation (Admit) 98  % -- --     Oxygen Saturation (Exercise) 95 % -- --     Rating of Perceived Exertion (Exercise) 9 13 14      Perceived Dyspnea (Exercise) 0 0 0     Symptoms hip fatigue none none     Comments Results first two weeks of exercise --     Duration -- Continue with 30 min of aerobic exercise without signs/symptoms of physical distress. Continue with 30 min of aerobic exercise without signs/symptoms of physical distress.     Intensity -- THRR unchanged THRR unchanged       Progression   Progression -- Continue to progress workloads to maintain intensity without signs/symptoms of physical distress. Continue to progress workloads to maintain intensity without signs/symptoms of physical distress.     Average METs -- 2.1 2.15       Resistance Training   Training Prescription -- Yes Yes     Weight -- 3lb 3lb     Reps -- 10-15 10-15       Interval Training   Interval Training -- No No       Treadmill   MPH -- 1 0.9     Grade -- 0 0     Minutes -- 15 15     METs -- 1.77 1.69       Recumbant Bike   Level -- 2 1     Watts -- 18 --     Minutes -- 15 15     METs -- 2.72 2.58       NuStep   Level -- 4 4     Minutes -- 15 15     METs -- 2.5 2.2       Biostep-RELP   Level -- -- 1     Minutes -- -- 15     METs -- -- 2       Oxygen   Maintain Oxygen Saturation -- 88% or higher 88% or higher              Exercise Comments:   Exercise Comments     Row Name 07/02/23 1153           Exercise Comments First full day of exercise!  Patient was oriented to gym and equipment including functions, settings, policies, and procedures.  Patient's individual exercise prescription and treatment plan were reviewed.  All starting workloads were established based on the results of the 6 minute walk test done at initial orientation visit.  The plan for exercise progression was also introduced and progression will be customized based on patient's performance and goals.                 Exercise Goals and Review:   Exercise Goals     Row Name 06/18/23 1547             Exercise Goals   Increase Physical Activity Yes       Intervention Provide advice, education, support and counseling about physical activity/exercise needs.;Develop an individualized exercise prescription for aerobic and resistive  training based on initial evaluation findings, risk stratification, comorbidities and participant's personal goals.       Expected Outcomes Short Term: Attend rehab on a regular basis to increase amount of physical activity.;Long Term: Add in home exercise to make exercise part of routine and to increase amount of physical activity.;Long Term: Exercising regularly at least 3-5 days a week.       Increase Strength and Stamina Yes       Intervention Provide advice, education, support and counseling about physical activity/exercise needs.;Develop an individualized exercise prescription for aerobic and resistive training based on initial evaluation findings, risk stratification, comorbidities and participant's personal goals.       Expected Outcomes Short Term: Increase workloads from initial exercise prescription for resistance, speed, and METs.;Short Term: Perform resistance training exercises routinely during rehab and add in resistance training at home;Long Term: Improve cardiorespiratory fitness, muscular endurance and strength as measured by increased METs and functional capacity ( )       Able to understand and use rate of perceived exertion (RPE) scale Yes       Intervention Provide education and explanation on how to use RPE scale       Expected Outcomes Short Term: Able to use RPE daily in rehab to express subjective intensity level;Long Term:  Able to use RPE to guide intensity level when exercising independently       Able to understand and use Dyspnea scale Yes       Intervention Provide education and explanation on how to use Dyspnea scale       Expected Outcomes  Short Term: Able to use Dyspnea scale daily in rehab to express subjective sense of shortness of breath during exertion;Long Term: Able to use Dyspnea scale to guide intensity level when exercising independently       Knowledge and understanding of Target Heart Rate Range (THRR) Yes       Intervention Provide education and explanation of THRR including how the numbers were predicted and where they are located for reference       Expected Outcomes Short Term: Able to state/look up THRR;Long Term: Able to use THRR to govern intensity when exercising independently;Short Term: Able to use daily as guideline for intensity in rehab       Able to check pulse independently Yes       Intervention Provide education and demonstration on how to check pulse in carotid and radial arteries.;Review the importance of being able to check your own pulse for safety during independent exercise       Expected Outcomes Long Term: Able to check pulse independently and accurately;Short Term: Able to explain why pulse checking is important during independent exercise       Understanding of Exercise Prescription Yes       Intervention Provide education, explanation, and written materials on patient's individual exercise prescription       Expected Outcomes Short Term: Able to explain program exercise prescription;Long Term: Able to explain home exercise prescription to exercise independently                Exercise Goals Re-Evaluation :  Exercise Goals Re-Evaluation     Row Name 07/02/23 1154 07/09/23 1115 07/18/23 1357 08/01/23 1133 08/01/23 1144     Exercise Goal Re-Evaluation   Exercise Goals Review Able to understand and use rate of perceived exertion (RPE) scale;Able to understand and use Dyspnea scale;Knowledge and understanding of Target Heart Rate Range (THRR);Understanding of Exercise Prescription Increase Physical Activity;Increase  Strength and Stamina;Understanding of Exercise Prescription Increase Physical  Activity;Increase Strength and Stamina;Understanding of Exercise Prescription Increase Physical Activity;Increase Strength and Stamina;Understanding of Exercise Prescription Increase Physical Activity;Increase Strength and Stamina;Understanding of Exercise Prescription   Comments Reviewed RPE and dyspnea scale, THR and program prescription with pt today.  Pt voiced understanding and was given a copy of goals to take home. Phong reports he is off to a good start in his exercise routine. He feels he is off to a good start in rehab. Patient has no concerns and exercise progression guidelines were discussed with patient for when he feels ready to increase his workloads. Sasha is off to a good start in rehab. He was able to complete his first 5 sessions during this review period. He was already able to increase his level on the T4 nustep from level 1 to 4. He was also able to increase his level on the recumbent brike from level 1 to 2. We will continue to monitor his progress in the program. Rashawn is doing well in rehab. He was able to use all the exercise machines in his prescription. He was unable to make any improvements during this review but we will continue to encourage and monitor his progress in the program. Zyair states that he feels like he is making some progress with his strength. He continues to attend cardiac rehab consistently and is working on increasing his workloads as tolerated. He sates he is now able to walk from the parking lot all the way down to rehab without any trouble.   Expected Outcomes Short: Use RPE daily to regulate intensity. Long: Follow program prescription in THR. Short: continue to attend rehab consistently and report to staff when exercise starts to feel easier so we can help him with progression. Long: become independent with exercise routine. Short: Continue to follow exercise prescription. Long: Continue exercise to improve strength and stamina. Short: Increase biostep to  level 2, continue to increase treadmill workloads. Long: Continue exercise to improve strength and stamina. Short: continue to increase workloads in cardiac rehab class and continue to improve strength and stamina. In the next few weeks home exercise guidlelines will be discussed with the patient.Long: start adding extra days of exercise at home once home exercise guidelines have been discussed.            Discharge Exercise Prescription (Final Exercise Prescription Changes):  Exercise Prescription Changes - 08/01/23 1100       Response to Exercise   Blood Pressure (Admit) 118/60    Blood Pressure (Exercise) 110/58    Blood Pressure (Exit) 98/60    Heart Rate (Admit) 68 bpm    Heart Rate (Exercise) 90 bpm    Heart Rate (Exit) 57 bpm    Rating of Perceived Exertion (Exercise) 14    Perceived Dyspnea (Exercise) 0    Symptoms none    Duration Continue with 30 min of aerobic exercise without signs/symptoms of physical distress.    Intensity THRR unchanged      Progression   Progression Continue to progress workloads to maintain intensity without signs/symptoms of physical distress.    Average METs 2.15      Resistance Training   Training Prescription Yes    Weight 3lb    Reps 10-15      Interval Training   Interval Training No      Treadmill   MPH 0.9    Grade 0    Minutes 15    METs 1.69  Recumbant Bike   Level 1    Minutes 15    METs 2.58      NuStep   Level 4    Minutes 15    METs 2.2      Biostep-RELP   Level 1    Minutes 15    METs 2      Oxygen   Maintain Oxygen Saturation 88% or higher             Nutrition:  Target Goals: Understanding of nutrition guidelines, daily intake of sodium 1500mg , cholesterol 200mg , calories 30% from fat and 7% or less from saturated fats, daily to have 5 or more servings of fruits and vegetables.  Education: All About Nutrition: -Group instruction provided by verbal, written material, interactive activities,  discussions, models, and posters to present general guidelines for heart healthy nutrition including fat, fiber, MyPlate, the role of sodium in heart healthy nutrition, utilization of the nutrition label, and utilization of this knowledge for meal planning. Follow up email sent as well. Written material given at graduation. Flowsheet Row Cardiac Rehab from 06/18/2023 in [facility] Cardiac and Pulmonary Rehab  Education need identified 06/18/23       Biometrics:  Pre Biometrics - 06/18/23 1548       Pre Biometrics   Height 5\' 9"  (1.753 m)    Weight 177 lb 14.4 oz (80.7 kg)    Waist Circumference 37 inches    Hip Circumference 39 inches    Waist to Hip Ratio 0.95 %    BMI (Calculated) 26.26    Single Leg Stand 2.01 seconds              Nutrition Therapy Plan and Nutrition Goals:  Nutrition Therapy & Goals - 06/18/23 1542       Nutrition Therapy   RD appointment deferred Yes      Intervention Plan   Intervention Prescribe, educate and counsel regarding individualized specific dietary modifications aiming towards targeted core components such as weight, hypertension, lipid management, diabetes, heart failure and other comorbidities.    Expected Outcomes Short Term Goal: Understand basic principles of dietary content, such as calories, fat, sodium, cholesterol and nutrients.;Short Term Goal: A plan has been developed with personal nutrition goals set during dietitian appointment.;Long Term Goal: Adherence to prescribed nutrition plan.             Nutrition Assessments:  MEDIFICTS Score Key: >=70 Need to make dietary changes  40-70 Heart Healthy Diet <= 40 Therapeutic Level Cholesterol Diet  Flowsheet Row Cardiac Rehab from 06/18/2023 in [facility] Cardiac and Pulmonary Rehab  Picture Your Plate Total Score on Admission 45      Picture Your Plate Scores: <04 Unhealthy dietary pattern with much room for improvement. 41-50 Dietary pattern unlikely to meet recommendations for  good health and room for improvement. 51-60 More healthful dietary pattern, with some room for improvement.  >60 Healthy dietary pattern, although there may be some specific behaviors that could be improved.    Nutrition Goals Re-Evaluation:  Nutrition Goals Re-Evaluation     Row Name 07/09/23 1122 08/01/23 1139           Goals   Comment Continues to defer RD apt. Continues to defer RD apt.               Nutrition Goals Discharge (Final Nutrition Goals Re-Evaluation):  Nutrition Goals Re-Evaluation - 08/01/23 1139       Goals   Comment Continues to defer RD apt.  Psychosocial: Target Goals: Acknowledge presence or absence of significant depression and/or stress, maximize coping skills, provide positive support system. Participant is able to verbalize types and ability to use techniques and skills needed for reducing stress and depression.   Education: Stress, Anxiety, and Depression - Group verbal and visual presentation to define topics covered.  Reviews how body is impacted by stress, anxiety, and depression.  Also discusses healthy ways to reduce stress and to treat/manage anxiety and depression.  Written material given at graduation.   Education: Sleep Hygiene -Provides group verbal and written instruction about how sleep can affect your health.  Define sleep hygiene, discuss sleep cycles and impact of sleep habits. Review good sleep hygiene tips.    Initial Review & Psychosocial Screening:  Initial Psych Review & Screening - 06/13/23 1434       Initial Review   Current issues with None Identified      Family Dynamics   Good Support System? Yes    Comments He can look to his wife for support. He does not take any medications for his mood.      Barriers   Psychosocial barriers to participate in program The patient should benefit from training in stress management and relaxation.;There are no identifiable barriers or psychosocial needs.       Screening Interventions   Interventions Encouraged to exercise;To provide support and resources with identified psychosocial needs;Provide feedback about the scores to participant    Expected Outcomes Short Term goal: Utilizing psychosocial counselor, staff and physician to assist with identification of specific Stressors or current issues interfering with healing process. Setting desired goal for each stressor or current issue identified.;Long Term Goal: Stressors or current issues are controlled or eliminated.;Short Term goal: Identification and review with participant of any Quality of Life or Depression concerns found by scoring the questionnaire.;Long Term goal: The participant improves quality of Life and PHQ9 Scores as seen by post scores and/or verbalization of changes             Quality of Life Scores:   Quality of Life - 06/18/23 1541       Quality of Life   Select Quality of Life      Quality of Life Scores   Health/Function Pre 21.13 %    Socioeconomic Pre 23.88 %    Psych/Spiritual Pre 25.14 %    Family Pre 27.6 %    GLOBAL Pre 23.49 %            Scores of 19 and below usually indicate a poorer quality of life in these areas.  A difference of  2-3 points is a clinically meaningful difference.  A difference of 2-3 points in the total score of the Quality of Life Index has been associated with significant improvement in overall quality of life, self-image, physical symptoms, and general health in studies assessing change in quality of life.  PHQ-9: Review Flowsheet       08/01/2023 07/09/2023 06/18/2023  Depression screen PHQ 2/9  Decreased Interest 0 0 2  Down, Depressed, Hopeless 0 0 1  PHQ - 2 Score 0 0 3  Altered sleeping 2 1 2   Tired, decreased energy 1 2 1   Change in appetite 2 1 2   Feeling bad or failure about yourself  0 0 0  Trouble concentrating 2 0 1  Moving slowly or fidgety/restless 2 2 1   Suicidal thoughts 0 0 0  PHQ-9 Score 9 6 10   Difficult  doing work/chores Not difficult at  all Not difficult at all Somewhat difficult   Interpretation of Total Score  Total Score Depression Severity:  1-4 = Minimal depression, 5-9 = Mild depression, 10-14 = Moderate depression, 15-19 = Moderately severe depression, 20-27 = Severe depression   Psychosocial Evaluation and Intervention:  Psychosocial Evaluation - 06/13/23 1435       Psychosocial Evaluation & Interventions   Interventions Relaxation education;Stress management education;Encouraged to exercise with the program and follow exercise prescription    Comments He can look to his wife for support. He does not take any medications for his mood.    Expected Outcomes Short: Start [facility] to help with mood. Long: Maintain a healthy mental state    Continue Psychosocial Services  Follow up required by staff             Psychosocial Re-Evaluation:  Psychosocial Re-Evaluation     Row Name 07/09/23 1120 08/01/23 1151           Psychosocial Re-Evaluation   Current issues with None Identified None Identified      Comments Patient PHQ was re-evalusted. His score went down from a 10 to a 6. He reports no stress, sleep, or mental health concerns. Patient's PHQ was re-evaluated and his score went from a 6 to a 9. He does report that he does not feel these symptoms are due to a mental health concern, but mostly related to his physical condition and old age. He reports no new concerns with sleep, stress, or mental health.      Expected Outcomes Short: continue to attend cardiac rehab consistently to gain strength and for mental health benefit. Long: maintain good mental health habits. Short: continue to attend cardiac rehab  for physical and mental health benefit. Long: maintain good mental health habits.      Interventions Encouraged to attend Cardiac Rehabilitation for the exercise Encouraged to attend Cardiac Rehabilitation for the exercise      Continue Psychosocial Services  Follow up  required by staff Follow up required by staff               Psychosocial Discharge (Final Psychosocial Re-Evaluation):  Psychosocial Re-Evaluation - 08/01/23 1151       Psychosocial Re-Evaluation   Current issues with None Identified    Comments Patient's PHQ was re-evaluated and his score went from a 6 to a 9. He does report that he does not feel these symptoms are due to a mental health concern, but mostly related to his physical condition and old age. He reports no new concerns with sleep, stress, or mental health.    Expected Outcomes Short: continue to attend cardiac rehab  for physical and mental health benefit. Long: maintain good mental health habits.    Interventions Encouraged to attend Cardiac Rehabilitation for the exercise    Continue Psychosocial Services  Follow up required by staff             Vocational Rehabilitation: Provide vocational rehab assistance to qualifying candidates.   Vocational Rehab Evaluation & Intervention:   Education: Education Goals: Education classes will be provided on a variety of topics geared toward better understanding of heart health and risk factor modification. Participant will state understanding/return demonstration of topics presented as noted by education test scores.  Learning Barriers/Preferences:  Learning Barriers/Preferences - 06/13/23 1433       Learning Barriers/Preferences   Learning Barriers None    Learning Preferences None             General  Cardiac Education Topics:  AED/CPR: - Group verbal and written instruction with the use of models to demonstrate the basic use of the AED with the basic ABC's of resuscitation.   Anatomy and Cardiac Procedures: - Group verbal and visual presentation and models provide information about basic cardiac anatomy and function. Reviews the testing methods done to diagnose heart disease and the outcomes of the test results. Describes the treatment choices: Medical  Management, Angioplasty, or Coronary Bypass Surgery for treating various heart conditions including Myocardial Infarction, Angina, Valve Disease, and Cardiac Arrhythmias.  Written material given at graduation. Flowsheet Row Cardiac Rehab from 06/18/2023 in [facility] Cardiac and Pulmonary Rehab  Education need identified 06/18/23       Medication Safety: - Group verbal and visual instruction to review commonly prescribed medications for heart and lung disease. Reviews the medication, class of the drug, and side effects. Includes the steps to properly store meds and maintain the prescription regimen.  Written material given at graduation. Flowsheet Row Cardiac Rehab from 06/18/2023 in [facility] Cardiac and Pulmonary Rehab  Education need identified 06/18/23       Intimacy: - Group verbal instruction through game format to discuss how heart and lung disease can affect sexual intimacy. Written material given at graduation..   Know Your Numbers and Heart Failure: - Group verbal and visual instruction to discuss disease risk factors for cardiac and pulmonary disease and treatment options.  Reviews associated critical values for Overweight/Obesity, Hypertension, Cholesterol, and Diabetes.  Discusses basics of heart failure: signs/symptoms and treatments.  Introduces Heart Failure Zone chart for action plan for heart failure.  Written material given at graduation.   Infection Prevention: - Provides verbal and written material to individual with discussion of infection control including proper hand washing and proper equipment cleaning during exercise session. Flowsheet Row Cardiac Rehab from 06/18/2023 in [facility] Cardiac and Pulmonary Rehab  Date 06/13/23  Educator [facility]  Instruction Review Code 1- Verbalizes Understanding       Falls Prevention: - Provides verbal and written material to individual with discussion of falls prevention and safety. Flowsheet Row Cardiac Rehab from 06/18/2023 in [facility] Cardiac  and Pulmonary Rehab  Date 06/13/23  Educator [facility]  Instruction Review Code 1- Verbalizes Understanding       Other: -Provides group and verbal instruction on various topics (see comments)   Knowledge Questionnaire Score:  Knowledge Questionnaire Score - 06/18/23 1541       Knowledge Questionnaire Score   Pre Score 21/26             Core Components/Risk Factors/Patient Goals at Admission:  Personal Goals and Risk Factors at Admission - 06/13/23 1433       Core Components/Risk Factors/Patient Goals on Admission    Weight Management Yes;Weight Maintenance    Intervention Weight Management: Develop a combined nutrition and exercise program designed to reach desired caloric intake, while maintaining appropriate intake of nutrient and fiber, sodium and fats, and appropriate energy expenditure required for the weight goal.;Weight Management: Provide education and appropriate resources to help participant work on and attain dietary goals.;Weight Management/Obesity: Establish reasonable short term and long term weight goals.    Expected Outcomes Short Term: Continue to assess and modify interventions until short term weight is achieved;Weight Maintenance: Understanding of the daily nutrition guidelines, which includes 25-35% calories from fat, 7% or less cal from saturated fats, less than 200mg  cholesterol, less than 1.5gm of sodium, & 5 or more servings of fruits and vegetables daily;Understanding recommendations for meals to  include 15-35% energy as protein, 25-35% energy from fat, 35-60% energy from carbohydrates, less than 200mg  of dietary cholesterol, 20-35 gm of total fiber daily;Understanding of distribution of calorie intake throughout the day with the consumption of 4-5 meals/snacks;Long Term: Adherence to nutrition and physical activity/exercise program aimed toward attainment of established weight goal    Heart Failure Yes    Intervention Provide a combined exercise and nutrition  program that is supplemented with education, support and counseling about heart failure. Directed toward relieving symptoms such as shortness of breath, decreased exercise tolerance, and extremity edema.    Expected Outcomes Improve functional capacity of life;Short term: Attendance in program 2-3 days a week with increased exercise capacity. Reported lower sodium intake. Reported increased fruit and vegetable intake. Reports medication compliance.;Short term: Daily weights obtained and reported for increase. Utilizing diuretic protocols set by physician.;Long term: Adoption of self-care skills and reduction of barriers for early signs and symptoms recognition and intervention leading to self-care maintenance.    Hypertension Yes    Intervention Provide education on lifestyle modifcations including regular physical activity/exercise, weight management, moderate sodium restriction and increased consumption of fresh fruit, vegetables, and low fat dairy, alcohol moderation, and smoking cessation.;Monitor prescription use compliance.    Expected Outcomes Short Term: Continued assessment and intervention until BP is < 140/69mm HG in hypertensive participants. < 130/36mm HG in hypertensive participants with diabetes, heart failure or chronic kidney disease.;Long Term: Maintenance of blood pressure at goal levels.    Lipids Yes    Intervention Provide education and support for participant on nutrition & aerobic/resistive exercise along with prescribed medications to achieve LDL 70mg , HDL >40mg .    Expected Outcomes Short Term: Participant states understanding of desired cholesterol values and is compliant with medications prescribed. Participant is following exercise prescription and nutrition guidelines.;Long Term: Cholesterol controlled with medications as prescribed, with individualized exercise RX and with personalized nutrition plan. Value goals: LDL < 70mg , HDL > 40 mg.              Education:Diabetes - Individual verbal and written instruction to review signs/symptoms of diabetes, desired ranges of glucose level fasting, after meals and with exercise. Acknowledge that pre and post exercise glucose checks will be done for 3 sessions at entry of program.   Core Components/Risk Factors/Patient Goals Review:   Goals and Risk Factor Review     Row Name 07/09/23 1121 08/01/23 1154           Core Components/Risk Factors/Patient Goals Review   Personal Goals Review Hypertension Hypertension;Lipids;Heart Failure      Review Patient reports taking BP at home. He has no current concerns about managing his BP. Patient reports that he continues to monitor BP and weight at home. His weight has been consistent and he also monitors for signs of fluid retention  and heart failure. He reports not current concerns. He takes all BP and cholesterol meds and reports no concerns with his medications.      Expected Outcomes Short: continue to check BP at home. Long: control cardiac risk factors. Short: continue to monitor weight and blood pressure at home consistently. Long: continue to control cardiac risk factors.               Core Components/Risk Factors/Patient Goals at Discharge (Final Review):   Goals and Risk Factor Review - 08/01/23 1154       Core Components/Risk Factors/Patient Goals Review   Personal Goals Review Hypertension;Lipids;Heart Failure    Review Patient reports that  he continues to monitor BP and weight at home. His weight has been consistent and he also monitors for signs of fluid retention  and heart failure. He reports not current concerns. He takes all BP and cholesterol meds and reports no concerns with his medications.    Expected Outcomes Short: continue to monitor weight and blood pressure at home consistently. Long: continue to control cardiac risk factors.             ITP Comments:  ITP Comments     Row Name 06/13/23 1430 06/18/23 1534  07/02/23 1153 07/11/23 1254 08/08/23 0756   ITP Comments Virtual Visit completed. Patient informed on EP and RD appointment and 6 Minute walk test. Patient also informed of patient health questionnaires on My Chart. Patient Verbalizes understanding. Visit diagnosis can be found in [facility] 02/25/[facility]23. Completed and gym orientation. Initial ITP created and sent for review to Dr. Daniel Nones, Medical Director. First full day of exercise!  Patient was oriented to gym and equipment including functions, settings, policies, and procedures.  Patient's individual exercise prescription and treatment plan were reviewed.  All starting workloads were established based on the results of the 6 minute walk test done at initial orientation visit.  The plan for exercise progression was also introduced and progression will be customized based on patient's performance and goals. 30 Day review completed. Medical Director ITP review done, changes made as directed, and signed approval by Medical Director.    new to program 30 Day review completed. Medical Director ITP review done, changes made as directed, and signed approval by Medical Director.            Comments:

## 2023-08-10 DIAGNOSIS — I214 Non-ST elevation (NSTEMI) myocardial infarction: Secondary | ICD-10-CM

## 2023-08-10 DIAGNOSIS — Z5189 Encounter for other specified aftercare: Secondary | ICD-10-CM | POA: Diagnosis not present

## 2023-08-10 NOTE — Progress Notes (Signed)
Daily Session Note  Patient Details  Name: Shane Peters MRN: [id number] Date of Birth: 02-09-1934 Referring Provider:   Flowsheet Row Cardiac Rehab from 06/18/2023 in [facility] Cardiac and Pulmonary Rehab  Referring Provider Dr. Massie Maroon, MD       Encounter Date: 08/10/2023  Check In:  Session Check In - 08/10/23 1107       Check-In   Supervising physician immediately available to respond to emergencies See telemetry face sheet for immediately available ER MD    Location [facility]-Cardiac & Pulmonary Rehab    Staff Present Kelton Pillar RN,BSN,MPA;Noah Tickle, BS, Exercise Physiologist;Joseph Hollace Kinnier    Virtual Visit No    Medication changes reported     No    Fall or balance concerns reported    Yes    Comments fell on Tuesday, no injury    Warm-up and Cool-down Performed on first and last piece of equipment    Resistance Training Performed Yes    VAD Patient? No    PAD/SET Patient? No      Pain Assessment   Currently in Pain? No/denies                Social History   Tobacco Use  Smoking Status Never  Smokeless Tobacco Never    Goals Met:  Independence with exercise equipment Exercise tolerated well No report of concerns or symptoms today Strength training completed today  Goals Unmet:  Not Applicable  Comments: Pt able to follow exercise prescription today without complaint.  Will continue to monitor for progression.    Dr. Bethann Punches is Medical Director for [facility] Cardiac Rehabilitation.  Dr. Vida Rigger is Medical Director for [facility] Pulmonary Rehabilitation.

## 2023-08-13 ENCOUNTER — Encounter: Payer: Medicare Other | Admitting: *Deleted

## 2023-08-13 DIAGNOSIS — Z5189 Encounter for other specified aftercare: Secondary | ICD-10-CM | POA: Diagnosis not present

## 2023-08-13 DIAGNOSIS — I214 Non-ST elevation (NSTEMI) myocardial infarction: Secondary | ICD-10-CM

## 2023-08-13 NOTE — Progress Notes (Signed)
Daily Session Note  Patient Details  Name: Shane Peters MRN: [id number] Date of Birth: [birth year] Referring Provider:   Flowsheet Row Cardiac Rehab from 06/18/2023 in [facility] Cardiac and Pulmonary Rehab  Referring Provider Dr. Massie Maroon, MD       Encounter Date: 08/13/2023  Check In:  Session Check In - 08/13/23 1135       Check-In   Supervising physician immediately available to respond to emergencies See telemetry face sheet for immediately available ER MD    Location [facility]-Cardiac & Pulmonary Rehab    Staff Present Cora Collum, RN, BSN, CCRP;Meredith Jewel Baize RN,BSN;Kelly Barataria BS, ACSM CEP, Exercise Physiologist;Maxon Conetta BS, Exercise Physiologist    Virtual Visit No    Medication changes reported     No    Fall or balance concerns reported    No    Warm-up and Cool-down Performed on first and last piece of equipment    Resistance Training Performed Yes    VAD Patient? No    PAD/SET Patient? No      Pain Assessment   Currently in Pain? No/denies                Social History   Tobacco Use  Smoking Status Never  Smokeless Tobacco Never    Goals Met:  Independence with exercise equipment Exercise tolerated well No report of concerns or symptoms today  Goals Unmet:  Not Applicable  Comments: Pt able to follow exercise prescription today without complaint.  Will continue to monitor for progression.    Dr. Bethann Punches is Medical Director for [facility] Cardiac Rehabilitation.  Dr. Vida Rigger is Medical Director for [facility] Pulmonary Rehabilitation.

## 2023-08-17 ENCOUNTER — Encounter: Payer: Medicare Other | Admitting: *Deleted

## 2023-08-17 DIAGNOSIS — Z5189 Encounter for other specified aftercare: Secondary | ICD-10-CM | POA: Diagnosis not present

## 2023-08-17 DIAGNOSIS — I214 Non-ST elevation (NSTEMI) myocardial infarction: Secondary | ICD-10-CM

## 2023-08-17 NOTE — Progress Notes (Signed)
Daily Session Note  Patient Details  Name: Shane Peters MRN: [id number] Date of Birth: Sep 22, 1933 Referring Provider:   Flowsheet Row Cardiac Rehab from 06/18/2023 in [facility] Cardiac and Pulmonary Rehab  Referring Provider Dr. Massie Maroon, MD       Encounter Date: 08/17/2023  Check In:  Session Check In - 08/17/23 1114       Check-In   Supervising physician immediately available to respond to emergencies See telemetry face sheet for immediately available ER MD    Location [facility]-Cardiac & Pulmonary Rehab    Staff Present Cora Collum, RN, BSN, CCRP;Joseph Hood RCP,RRT,BSRT;Noah Tickle, [place], Exercise Physiologist    Virtual Visit No    Medication changes reported     No    Fall or balance concerns reported    No    Warm-up and Cool-down Performed on first and last piece of equipment    Resistance Training Performed Yes    VAD Patient? No    PAD/SET Patient? No      Pain Assessment   Currently in Pain? No/denies                Social History   Tobacco Use  Smoking Status Never  Smokeless Tobacco Never    Goals Met:  Independence with exercise equipment Exercise tolerated well No report of concerns or symptoms today  Goals Unmet:  Not Applicable  Comments: Pt able to follow exercise prescription today without complaint.  Will continue to monitor for progression.    Dr. Bethann Punches is Medical Director for [facility] Cardiac Rehabilitation.  Dr. Vida Rigger is Medical Director for [facility] Pulmonary Rehabilitation.

## 2023-08-28 ENCOUNTER — Encounter: Payer: Self-pay | Admitting: *Deleted

## 2023-08-28 DIAGNOSIS — I214 Non-ST elevation (NSTEMI) myocardial infarction: Secondary | ICD-10-CM

## 2023-08-28 NOTE — Progress Notes (Signed)
 Early Discharge Summary   Kayla Weekes DOB: 09/20/1933  Mr. Kall's wife called to inform staff that he is in the hospital again due to falls and fluid buildup in his lungs. She requested that we discharge him at this time as she does not know when he will be healthy enough to return. He has completed 18 of 36 sessions.    6 Minute Walk     Row Name 06/18/23 1544         6 Minute Walk   Phase Initial     Distance 660 feet     Walk Time 6 minutes     # of Rest Breaks 0     MPH 1.25     METS 1     RPE 9     Perceived Dyspnea  0     VO2 Peak 3.11     Symptoms Yes (comment)     Comments hip fatigue     Resting HR 60 bpm     Resting BP 110/58     Resting Oxygen Saturation  98 %     Exercise Oxygen Saturation  during 6 min walk 95 %     Max Ex. HR 96 bpm     Max Ex. BP 126/60     2 Minute Post BP 114/54

## 2023-08-28 NOTE — Progress Notes (Signed)
 Cardiac Individual Treatment Plan  Patient Details  Name: Shane Peters MRN: [id number] Date of Birth: 1934/03/10 Referring Provider:   Flowsheet Row Cardiac Rehab from 06/18/2023 in [facility] Cardiac and Pulmonary Rehab  Referring Provider Dr. Massie Maroon, MD       Initial Encounter Date:  Flowsheet Row Cardiac Rehab from 06/18/2023 in [facility] Cardiac and Pulmonary Rehab  Date 06/18/23       Visit Diagnosis: NSTEMI (non-ST elevated myocardial infarction) [facility])  Patient's Home Medications on Admission:  Current Outpatient Medications:    albuterol (VENTOLIN HFA) 108 (90 Base) MCG/ACT inhaler, Inhale 1-2 puffs into the lungs every 6 (six) hours as needed for wheezing or shortness of breath. (Patient not taking: Reported on 06/13/2023), Disp: 18 g, Rfl: 0   amiodarone (PACERONE) 200 MG tablet, Take 200 mg by mouth daily. (Patient not taking: Reported on 06/13/2023), Disp: , Rfl:    apixaban (ELIQUIS) 5 MG TABS tablet, Take by mouth., Disp: , Rfl:    aspirin EC 81 MG tablet, Take 81 mg by mouth at bedtime. (Patient not taking: Reported on 06/13/2023), Disp: , Rfl:    aspirin EC 81 MG tablet, Take 1 tablet by mouth daily., Disp: , Rfl:    atenolol (TENORMIN) 100 MG tablet, Take 100 mg by mouth daily. (Patient not taking: Reported on 06/13/2023), Disp: , Rfl:    atorvastatin (LIPITOR) 80 MG tablet, Take 80 mg by mouth at bedtime.  (Patient not taking: Reported on 06/13/2023), Disp: , Rfl:    atorvastatin (LIPITOR) 80 MG tablet, Take by mouth. (Patient not taking: Reported on 06/13/2023), Disp: , Rfl:    azelastine (ASTELIN) 0.1 % nasal spray, Place 2 sprays into both nostrils 2 (two) times daily. (Patient not taking: Reported on 06/13/2023), Disp: , Rfl:    Cinnamon 500 MG capsule, Take 500 mg by mouth at bedtime. (Patient not taking: Reported on 06/13/2023), Disp: , Rfl:    clopidogrel (PLAVIX) 75 MG tablet, Take 75 mg by mouth daily., Disp: , Rfl:    diphenhydramine-acetaminophen (TYLENOL PM)  25-500 MG TABS tablet, Take 2 tablets by mouth at bedtime., Disp: , Rfl:    doxycycline (VIBRAMYCIN) 100 MG capsule, Take 1 capsule (100 mg total) by mouth 2 (two) times daily. (Patient not taking: Reported on 06/13/2023), Disp: 20 capsule, Rfl: 0   empagliflozin (JARDIANCE) 10 MG TABS tablet, Take by mouth., Disp: , Rfl:    esomeprazole (NEXIUM) 20 MG capsule, Take 20 mg by mouth daily before breakfast. (Patient not taking: Reported on 06/13/2023), Disp: , Rfl:    esomeprazole (NEXIUM) 20 MG capsule, Take by mouth. (Patient not taking: Reported on 06/13/2023), Disp: , Rfl:    esomeprazole (NEXIUM) 40 MG capsule, Take by mouth., Disp: , Rfl:    ezetimibe (ZETIA) 10 MG tablet, Take 10 mg by mouth daily., Disp: , Rfl:    fluticasone (FLONASE) 50 MCG/ACT nasal spray, 1 spray into each nostril daily. Frequency:QD   Dosage:50   MCG  Instructions:  Note:Dose: , Disp: , Rfl:    furosemide (LASIX) 40 MG tablet, Take by mouth., Disp: , Rfl:    gabapentin (NEURONTIN) 100 MG capsule, Take by mouth., Disp: , Rfl:    hydrochlorothiazide (MICROZIDE) 12.5 MG capsule, Take 12.5 mg by mouth daily., Disp: , Rfl:    isosorbide mononitrate (IMDUR) 60 MG 24 hr tablet, Take by mouth., Disp: , Rfl:    levothyroxine (SYNTHROID) 75 MCG tablet, Take by mouth. (Patient not taking: Reported on 06/13/2023), Disp: , Rfl:  levothyroxine (SYNTHROID, LEVOTHROID) 75 MCG tablet, Take 75 mcg by mouth daily before breakfast., Disp: , Rfl:    losartan (COZAAR) 25 MG tablet, Take 1 tablet by mouth at bedtime., Disp: , Rfl:    metoprolol succinate (TOPROL-XL) 50 MG 24 hr tablet, Take by mouth., Disp: , Rfl:    montelukast (SINGULAIR) 10 MG tablet, Take 10 mg by mouth daily. (Patient not taking: Reported on 06/13/2023), Disp: , Rfl:    nitroGLYCERIN (NITROSTAT) 0.4 MG SL tablet, Instructions:  sublingual as needed for chest pain, Disp: , Rfl:    omega-3 acid ethyl esters (LOVAZA) 1 g capsule, Take 1 g by mouth at bedtime., Disp: ,  Rfl:    omega-3 acid ethyl esters (LOVAZA) 1 g capsule, Take 2 capsules by mouth daily. (Patient not taking: Reported on 06/13/2023), Disp: , Rfl:    ranolazine (RANEXA) 1000 MG SR tablet, Take 1,000 mg by mouth 2 (two) times daily., Disp: , Rfl:    ranolazine (RANEXA) 500 MG 12 hr tablet, Take 500 mg by mouth 2 (two) times daily. (Patient not taking: Reported on 06/13/2023), Disp: , Rfl:    rosuvastatin (CRESTOR) 20 MG tablet, Take 20 mg by mouth at bedtime., Disp: , Rfl:    spironolactone (ALDACTONE) 25 MG tablet, Take 0.5 tablets by mouth daily., Disp: , Rfl:   Past Medical History: Past Medical History:  Diagnosis Date   Coronary artery disease    Hyperlipidemia    Hypertension     Tobacco Use: Social History   Tobacco Use  Smoking Status Never  Smokeless Tobacco Never    Labs: Review Flowsheet        No data to display           Exercise Target Goals: Exercise Program Goal: Individual exercise prescription set using results from initial 6 min walk test and THRR while considering  patient's activity barriers and safety.   Exercise Prescription Goal: Initial exercise prescription builds to 30-45 minutes a day of aerobic activity, 2-3 days per week.  Home exercise guidelines will be given to patient during program as part of exercise prescription that the participant will acknowledge.   Education: Aerobic Exercise: - Group verbal and visual presentation on the components of exercise prescription. Introduces F.I.T.T principle from ACSM for exercise prescriptions.  Reviews F.I.T.T. principles of aerobic exercise including progression. Written material given at graduation. Flowsheet Row Cardiac Rehab from 06/18/2023 in [facility] Cardiac and Pulmonary Rehab  Education need identified 06/18/23       Education: Resistance Exercise: - Group verbal and visual presentation on the components of exercise prescription. Introduces F.I.T.T principle from ACSM for exercise  prescriptions  Reviews F.I.T.T. principles of resistance exercise including progression. Written material given at graduation.    Education: Exercise & Equipment Safety: - Individual verbal instruction and demonstration of equipment use and safety with use of the equipment. Flowsheet Row Cardiac Rehab from 06/18/2023 in [facility] Cardiac and Pulmonary Rehab  Date 06/13/23  Educator [facility]  Instruction Review Code 1- Verbalizes Understanding       Education: Exercise Physiology & General Exercise Guidelines: - Group verbal and written instruction with models to review the exercise physiology of the cardiovascular system and associated critical values. Provides general exercise guidelines with specific guidelines to those with heart or lung disease.    Education: Flexibility, Balance, Mind/Body Relaxation: - Group verbal and visual presentation with interactive activity on the components of exercise prescription. Introduces F.I.T.T principle from ACSM for exercise prescriptions. Reviews F.I.T.T. principles of  flexibility and balance exercise training including progression. Also discusses the mind body connection.  Reviews various relaxation techniques to help reduce and manage stress (i.e. Deep breathing, progressive muscle relaxation, and visualization). Balance handout provided to take home. Written material given at graduation.   Activity Barriers & Risk Stratification:  Activity Barriers & Cardiac Risk Stratification - 06/18/23 1546       Activity Barriers & Cardiac Risk Stratification   Activity Barriers Back Problems;Deconditioning;Muscular Weakness;Balance Concerns    Cardiac Risk Stratification High             6 Minute Walk:  6 Minute Walk     Row Name 06/18/23 1544         6 Minute Walk   Phase Initial     Distance 660 feet     Walk Time 6 minutes     # of Rest Breaks 0     MPH 1.25     METS 1     RPE 9     Perceived Dyspnea  0     VO2 Peak 3.11     Symptoms Yes  (comment)     Comments hip fatigue     Resting HR 60 bpm     Resting BP 110/58     Resting Oxygen Saturation  98 %     Exercise Oxygen Saturation  during 6 min walk 95 %     Max Ex. HR 96 bpm     Max Ex. BP 126/60     2 Minute Post BP 114/54              Oxygen Initial Assessment:   Oxygen Re-Evaluation:   Oxygen Discharge (Final Oxygen Re-Evaluation):   Initial Exercise Prescription:  Initial Exercise Prescription - 06/18/23 1500       Date of Initial Exercise RX and Referring Provider   Date 06/18/23    Referring Provider Dr. Massie Maroon, MD      Oxygen   Maintain Oxygen Saturation 88% or higher      Treadmill   MPH 1    Grade 0    Minutes 15    METs 1.8      Recumbant Bike   Level 1    RPM 50    Watts 10    Minutes 15    METs 1      NuStep   Level 1    SPM 80    Minutes 15    METs 1      Track   Laps 15    Minutes 15    METs 1.82      Prescription Details   Frequency (times per week) 3    Duration Progress to 30 minutes of continuous aerobic without signs/symptoms of physical distress      Intensity   THRR 40-80% of Max Heartrate 89-118    Ratings of Perceived Exertion 11-13    Perceived Dyspnea 0-4      Progression   Progression Continue to progress workloads to maintain intensity without signs/symptoms of physical distress.      Resistance Training   Training Prescription Yes    Weight 3 lb    Reps 10-15             Perform Capillary Blood Glucose checks as needed.  Exercise Prescription Changes:   Exercise Prescription Changes     Row Name 06/18/23 1500 07/18/23 1300 08/01/23 1100 08/15/23 1100       Response to Exercise  Blood Pressure (Admit) 110/58 106/52 118/60 114/56    Blood Pressure (Exercise) 126/60 130/68 110/58 112/54    Blood Pressure (Exit) 114/54 100/56 98/60 98/56     Heart Rate (Admit) 60 bpm 64 bpm 68 bpm 68 bpm    Heart Rate (Exercise) 96 bpm 98 bpm 90 bpm 87 bpm    Heart Rate (Exit) 65 bpm 67 bpm  57 bpm 67 bpm    Oxygen Saturation (Admit) 98 % -- -- --    Oxygen Saturation (Exercise) 95 % -- -- --    Rating of Perceived Exertion (Exercise) 9 13 14 13     Perceived Dyspnea (Exercise) 0 0 0 0    Symptoms hip fatigue none none none    Comments Results first two weeks of exercise -- --    Duration -- Continue with 30 min of aerobic exercise without signs/symptoms of physical distress. Continue with 30 min of aerobic exercise without signs/symptoms of physical distress. Continue with 30 min of aerobic exercise without signs/symptoms of physical distress.    Intensity -- THRR unchanged THRR unchanged THRR unchanged      Progression   Progression -- Continue to progress workloads to maintain intensity without signs/symptoms of physical distress. Continue to progress workloads to maintain intensity without signs/symptoms of physical distress. Continue to progress workloads to maintain intensity without signs/symptoms of physical distress.    Average METs -- 2.1 2.15 1.87      Resistance Training   Training Prescription -- Yes Yes Yes    Weight -- 3lb 3lb 3lb    Reps -- 10-15 10-15 10-15      Interval Training   Interval Training -- No No No      Treadmill   MPH -- 1 0.9 0.6    Grade -- 0 0 1    Minutes -- 15 15 15     METs -- 1.77 1.69 1.54      Recumbant Bike   Level -- 2 1 1     Watts -- 18 -- --    Minutes -- 15 15 15     METs -- 2.72 2.58 2.57      NuStep   Level -- 4 4 4     Minutes -- 15 15 15     METs -- 2.5 2.2 2.3      Biostep-RELP   Level -- -- 1 1    Minutes -- -- 15 15    METs -- -- 2 2      Track   Laps -- -- -- 4    Minutes -- -- -- 15    METs -- -- -- 1.27      Oxygen   Maintain Oxygen Saturation -- 88% or higher 88% or higher 88% or higher             Exercise Comments:   Exercise Comments     Row Name 07/02/23 1153           Exercise Comments First full day of exercise!  Patient was oriented to gym and equipment including functions,  settings, policies, and procedures.  Patient's individual exercise prescription and treatment plan were reviewed.  All starting workloads were established based on the results of the 6 minute walk test done at initial orientation visit.  The plan for exercise progression was also introduced and progression will be customized based on patient's performance and goals.                Exercise Goals and Review:  Exercise Goals     Row Name 06/18/23 1547             Exercise Goals   Increase Physical Activity Yes       Intervention Provide advice, education, support and counseling about physical activity/exercise needs.;Develop an individualized exercise prescription for aerobic and resistive training based on initial evaluation findings, risk stratification, comorbidities and participant's personal goals.       Expected Outcomes Short Term: Attend rehab on a regular basis to increase amount of physical activity.;Long Term: Add in home exercise to make exercise part of routine and to increase amount of physical activity.;Long Term: Exercising regularly at least 3-5 days a week.       Increase Strength and Stamina Yes       Intervention Provide advice, education, support and counseling about physical activity/exercise needs.;Develop an individualized exercise prescription for aerobic and resistive training based on initial evaluation findings, risk stratification, comorbidities and participant's personal goals.       Expected Outcomes Short Term: Increase workloads from initial exercise prescription for resistance, speed, and METs.;Short Term: Perform resistance training exercises routinely during rehab and add in resistance training at home;Long Term: Improve cardiorespiratory fitness, muscular endurance and strength as measured by increased METs and functional capacity ( )       Able to understand and use rate of perceived exertion (RPE) scale Yes       Intervention Provide education and  explanation on how to use RPE scale       Expected Outcomes Short Term: Able to use RPE daily in rehab to express subjective intensity level;Long Term:  Able to use RPE to guide intensity level when exercising independently       Able to understand and use Dyspnea scale Yes       Intervention Provide education and explanation on how to use Dyspnea scale       Expected Outcomes Short Term: Able to use Dyspnea scale daily in rehab to express subjective sense of shortness of breath during exertion;Long Term: Able to use Dyspnea scale to guide intensity level when exercising independently       Knowledge and understanding of Target Heart Rate Range (THRR) Yes       Intervention Provide education and explanation of THRR including how the numbers were predicted and where they are located for reference       Expected Outcomes Short Term: Able to state/look up THRR;Long Term: Able to use THRR to govern intensity when exercising independently;Short Term: Able to use daily as guideline for intensity in rehab       Able to check pulse independently Yes       Intervention Provide education and demonstration on how to check pulse in carotid and radial arteries.;Review the importance of being able to check your own pulse for safety during independent exercise       Expected Outcomes Long Term: Able to check pulse independently and accurately;Short Term: Able to explain why pulse checking is important during independent exercise       Understanding of Exercise Prescription Yes       Intervention Provide education, explanation, and written materials on patient's individual exercise prescription       Expected Outcomes Short Term: Able to explain program exercise prescription;Long Term: Able to explain home exercise prescription to exercise independently                Exercise Goals Re-Evaluation :  Exercise Goals Re-Evaluation  Row Name 07/02/23 1154 07/09/23 1115 07/18/23 1357 08/01/23 1133 08/01/23  1144     Exercise Goal Re-Evaluation   Exercise Goals Review Able to understand and use rate of perceived exertion (RPE) scale;Able to understand and use Dyspnea scale;Knowledge and understanding of Target Heart Rate Range (THRR);Understanding of Exercise Prescription Increase Physical Activity;Increase Strength and Stamina;Understanding of Exercise Prescription Increase Physical Activity;Increase Strength and Stamina;Understanding of Exercise Prescription Increase Physical Activity;Increase Strength and Stamina;Understanding of Exercise Prescription Increase Physical Activity;Increase Strength and Stamina;Understanding of Exercise Prescription   Comments Reviewed RPE and dyspnea scale, THR and program prescription with pt today.  Pt voiced understanding and was given a copy of goals to take home. Shane Peters reports he is off to a good start in his exercise routine. He feels he is off to a good start in rehab. Patient has no concerns and exercise progression guidelines were discussed with patient for when he feels ready to increase his workloads. Shane Peters is off to a good start in rehab. He was able to complete his first 5 sessions during this review period. He was already able to increase his level on the T4 nustep from level 1 to 4. He was also able to increase his level on the recumbent brike from level 1 to 2. We will continue to monitor his progress in the program. Shane Peters is doing well in rehab. He was able to use all the exercise machines in his prescription. He was unable to make any improvements during this review but we will continue to encourage and monitor his progress in the program. Shane Peters states that he feels like he is making some progress with his strength. He continues to attend cardiac rehab consistently and is working on increasing his workloads as tolerated. He sates he is now able to walk from the parking lot all the way down to rehab without any trouble.   Expected Outcomes Short: Use RPE daily  to regulate intensity. Long: Follow program prescription in THR. Short: continue to attend rehab consistently and report to staff when exercise starts to feel easier so we can help him with progression. Long: become independent with exercise routine. Short: Continue to follow exercise prescription. Long: Continue exercise to improve strength and stamina. Short: Increase biostep to level 2, continue to increase treadmill workloads. Long: Continue exercise to improve strength and stamina. Short: continue to increase workloads in cardiac rehab class and continue to improve strength and stamina. In the next few weeks home exercise guidlelines will be discussed with the patient.Long: start adding extra days of exercise at home once home exercise guidelines have been discussed.    Row Name 08/15/23 1129 08/15/23 1130           Exercise Goal Re-Evaluation   Exercise Goals Review Increase Physical Activity;Increase Strength and Stamina;Understanding of Exercise Prescription Increase Physical Activity;Increase Strength and Stamina;Understanding of Exercise Prescription      Comments Shane Peters is doing well in rehab. He was able to increase his percent grade on the treadmill from 0% to 1%. He was also able to maintain Shane Peters is doing well in rehab. He was able to increase his percent grade on the treadmill from 0% to 1%. He was also able to maintain a level 4 on the T4 nustep. We will continue to monitor his progress in the program.      Expected Outcomes -- Short: Continue to increase treadmill workload. Long: Continue exercise to improve strength and stamina.  Discharge Exercise Prescription (Final Exercise Prescription Changes):  Exercise Prescription Changes - 08/15/23 1100       Response to Exercise   Blood Pressure (Admit) 114/56    Blood Pressure (Exercise) 112/54    Blood Pressure (Exit) 98/56    Heart Rate (Admit) 68 bpm    Heart Rate (Exercise) 87 bpm    Heart Rate (Exit) 67 bpm     Rating of Perceived Exertion (Exercise) 13    Perceived Dyspnea (Exercise) 0    Symptoms none    Duration Continue with 30 min of aerobic exercise without signs/symptoms of physical distress.    Intensity THRR unchanged      Progression   Progression Continue to progress workloads to maintain intensity without signs/symptoms of physical distress.    Average METs 1.87      Resistance Training   Training Prescription Yes    Weight 3lb    Reps 10-15      Interval Training   Interval Training No      Treadmill   MPH 0.6    Grade 1    Minutes 15    METs 1.54      Recumbant Bike   Level 1    Minutes 15    METs 2.57      NuStep   Level 4    Minutes 15    METs 2.3      Biostep-RELP   Level 1    Minutes 15    METs 2      Track   Laps 4    Minutes 15    METs 1.27      Oxygen   Maintain Oxygen Saturation 88% or higher             Nutrition:  Target Goals: Understanding of nutrition guidelines, daily intake of sodium 1500mg , cholesterol 200mg , calories 30% from fat and 7% or less from saturated fats, daily to have 5 or more servings of fruits and vegetables.  Education: All About Nutrition: -Group instruction provided by verbal, written material, interactive activities, discussions, models, and posters to present general guidelines for heart healthy nutrition including fat, fiber, MyPlate, the role of sodium in heart healthy nutrition, utilization of the nutrition label, and utilization of this knowledge for meal planning. Follow up email sent as well. Written material given at graduation. Flowsheet Row Cardiac Rehab from 06/18/2023 in [facility] Cardiac and Pulmonary Rehab  Education need identified 06/18/23       Biometrics:  Pre Biometrics - 06/18/23 1548       Pre Biometrics   Height 5\' 9"  (1.753 m)    Weight 177 lb 14.4 oz (80.7 kg)    Waist Circumference 37 inches    Hip Circumference 39 inches    Waist to Hip Ratio 0.95 %    BMI (Calculated) 26.26     Single Leg Stand 2.01 seconds              Nutrition Therapy Plan and Nutrition Goals:  Nutrition Therapy & Goals - 06/18/23 1542       Nutrition Therapy   RD appointment deferred Yes      Intervention Plan   Intervention Prescribe, educate and counsel regarding individualized specific dietary modifications aiming towards targeted core components such as weight, hypertension, lipid management, diabetes, heart failure and other comorbidities.    Expected Outcomes Short Term Goal: Understand basic principles of dietary content, such as calories, fat, sodium, cholesterol and nutrients.;Short Term Goal: A plan has  been developed with personal nutrition goals set during dietitian appointment.;Long Term Goal: Adherence to prescribed nutrition plan.             Nutrition Assessments:  MEDIFICTS Score Key: >=70 Need to make dietary changes  40-70 Heart Healthy Diet <= 40 Therapeutic Level Cholesterol Diet  Flowsheet Row Cardiac Rehab from 06/18/2023 in [facility] Cardiac and Pulmonary Rehab  Picture Your Plate Total Score on Admission 45      Picture Your Plate Scores: <16 Unhealthy dietary pattern with much room for improvement. 41-50 Dietary pattern unlikely to meet recommendations for good health and room for improvement. 51-60 More healthful dietary pattern, with some room for improvement.  >60 Healthy dietary pattern, although there may be some specific behaviors that could be improved.    Nutrition Goals Re-Evaluation:  Nutrition Goals Re-Evaluation     Row Name 07/09/23 1122 08/01/23 1139 08/17/23 1118         Goals   Comment Continues to defer RD apt. Continues to defer RD apt. Continues to defer RD apt.              Nutrition Goals Discharge (Final Nutrition Goals Re-Evaluation):  Nutrition Goals Re-Evaluation - 08/17/23 1118       Goals   Comment Continues to defer RD apt.             Psychosocial: Target Goals: Acknowledge presence or absence  of significant depression and/or stress, maximize coping skills, provide positive support system. Participant is able to verbalize types and ability to use techniques and skills needed for reducing stress and depression.   Education: Stress, Anxiety, and Depression - Group verbal and visual presentation to define topics covered.  Reviews how body is impacted by stress, anxiety, and depression.  Also discusses healthy ways to reduce stress and to treat/manage anxiety and depression.  Written material given at graduation.   Education: Sleep Hygiene -Provides group verbal and written instruction about how sleep can affect your health.  Define sleep hygiene, discuss sleep cycles and impact of sleep habits. Review good sleep hygiene tips.    Initial Review & Psychosocial Screening:  Initial Psych Review & Screening - 06/13/23 1434       Initial Review   Current issues with None Identified      Family Dynamics   Good Support System? Yes    Comments He can look to his wife for support. He does not take any medications for his mood.      Barriers   Psychosocial barriers to participate in program The patient should benefit from training in stress management and relaxation.;There are no identifiable barriers or psychosocial needs.      Screening Interventions   Interventions Encouraged to exercise;To provide support and resources with identified psychosocial needs;Provide feedback about the scores to participant    Expected Outcomes Short Term goal: Utilizing psychosocial counselor, staff and physician to assist with identification of specific Stressors or current issues interfering with healing process. Setting desired goal for each stressor or current issue identified.;Long Term Goal: Stressors or current issues are controlled or eliminated.;Short Term goal: Identification and review with participant of any Quality of Life or Depression concerns found by scoring the questionnaire.;Long Term goal:  The participant improves quality of Life and PHQ9 Scores as seen by post scores and/or verbalization of changes             Quality of Life Scores:   Quality of Life - 06/18/23 1541  Quality of Life   Select Quality of Life      Quality of Life Scores   Health/Function Pre 21.13 %    Socioeconomic Pre 23.88 %    Psych/Spiritual Pre 25.14 %    Family Pre 27.6 %    GLOBAL Pre 23.49 %            Scores of 19 and below usually indicate a poorer quality of life in these areas.  A difference of  2-3 points is a clinically meaningful difference.  A difference of 2-3 points in the total score of the Quality of Life Index has been associated with significant improvement in overall quality of life, self-image, physical symptoms, and general health in studies assessing change in quality of life.  PHQ-9: Review Flowsheet       08/01/2023 07/09/2023 06/18/2023  Depression screen PHQ 2/9  Decreased Interest 0 0 2  Down, Depressed, Hopeless 0 0 1  PHQ - 2 Score 0 0 3  Altered sleeping 2 1 2   Tired, decreased energy 1 2 1   Change in appetite 2 1 2   Feeling bad or failure about yourself  0 0 0  Trouble concentrating 2 0 1  Moving slowly or fidgety/restless 2 2 1   Suicidal thoughts 0 0 0  PHQ-9 Score 9 6 10   Difficult doing work/chores Not difficult at all Not difficult at all Somewhat difficult   Interpretation of Total Score  Total Score Depression Severity:  1-4 = Minimal depression, 5-9 = Mild depression, 10-14 = Moderate depression, 15-19 = Moderately severe depression, 20-27 = Severe depression   Psychosocial Evaluation and Intervention:  Psychosocial Evaluation - 06/13/23 1435       Psychosocial Evaluation & Interventions   Interventions Relaxation education;Stress management education;Encouraged to exercise with the program and follow exercise prescription    Comments He can look to his wife for support. He does not take any medications for his mood.    Expected  Outcomes Short: Start [facility] to help with mood. Long: Maintain a healthy mental state    Continue Psychosocial Services  Follow up required by staff             Psychosocial Re-Evaluation:  Psychosocial Re-Evaluation     Row Name 07/09/23 1120 08/01/23 1151           Psychosocial Re-Evaluation   Current issues with None Identified None Identified      Comments Patient PHQ was re-evalusted. His score went down from a 10 to a 6. He reports no stress, sleep, or mental health concerns. Patient's PHQ was re-evaluated and his score went from a 6 to a 9. He does report that he does not feel these symptoms are due to a mental health concern, but mostly related to his physical condition and old age. He reports no new concerns with sleep, stress, or mental health.      Expected Outcomes Short: continue to attend cardiac rehab consistently to gain strength and for mental health benefit. Long: maintain good mental health habits. Short: continue to attend cardiac rehab  for physical and mental health benefit. Long: maintain good mental health habits.      Interventions Encouraged to attend Cardiac Rehabilitation for the exercise Encouraged to attend Cardiac Rehabilitation for the exercise      Continue Psychosocial Services  Follow up required by staff Follow up required by staff               Psychosocial Discharge (Final Psychosocial Re-Evaluation):  Psychosocial Re-Evaluation - 08/01/23 1151       Psychosocial Re-Evaluation   Current issues with None Identified    Comments Patient's PHQ was re-evaluated and his score went from a 6 to a 9. He does report that he does not feel these symptoms are due to a mental health concern, but mostly related to his physical condition and old age. He reports no new concerns with sleep, stress, or mental health.    Expected Outcomes Short: continue to attend cardiac rehab  for physical and mental health benefit. Long: maintain good mental health  habits.    Interventions Encouraged to attend Cardiac Rehabilitation for the exercise    Continue Psychosocial Services  Follow up required by staff             Vocational Rehabilitation: Provide vocational rehab assistance to qualifying candidates.   Vocational Rehab Evaluation & Intervention:   Education: Education Goals: Education classes will be provided on a variety of topics geared toward better understanding of heart health and risk factor modification. Participant will state understanding/return demonstration of topics presented as noted by education test scores.  Learning Barriers/Preferences:  Learning Barriers/Preferences - 06/13/23 1433       Learning Barriers/Preferences   Learning Barriers None    Learning Preferences None             General Cardiac Education Topics:  AED/CPR: - Group verbal and written instruction with the use of models to demonstrate the basic use of the AED with the basic ABC's of resuscitation.   Anatomy and Cardiac Procedures: - Group verbal and visual presentation and models provide information about basic cardiac anatomy and function. Reviews the testing methods done to diagnose heart disease and the outcomes of the test results. Describes the treatment choices: Medical Management, Angioplasty, or Coronary Bypass Surgery for treating various heart conditions including Myocardial Infarction, Angina, Valve Disease, and Cardiac Arrhythmias.  Written material given at graduation. Flowsheet Row Cardiac Rehab from 06/18/2023 in [facility] Cardiac and Pulmonary Rehab  Education need identified 06/18/23       Medication Safety: - Group verbal and visual instruction to review commonly prescribed medications for heart and lung disease. Reviews the medication, class of the drug, and side effects. Includes the steps to properly store meds and maintain the prescription regimen.  Written material given at graduation. Flowsheet Row Cardiac Rehab from  06/18/2023 in [facility] Cardiac and Pul[facility] Rehab  Education need identified 06/18/23       Intimacy: - Group verbal instruction through game format to discuss how heart and lung disease can affect sexual intimacy. Written material given at graduation..   Know Your Numbers and Heart Failure: - Group verbal and visual instruction to discuss disease risk factors for cardiac and pulmonary disease and treatment options.  Reviews associated critical values for Overweight/Obesity, Hypertension, Cholesterol, and Diabetes.  Discusses basics of heart failure: signs/symptoms and treatments.  Introduces Heart Failure Zone chart for action plan for heart failure.  Written material given at graduation.   Infection Prevention: - Provides verbal and written material to individual with discussion of infection control including proper hand washing and proper equipment cleaning during exercise session. Flowsheet Row Cardiac Rehab from 06/18/2023 in [facility] Cardiac and Pulmonary Rehab  Date 06/13/23  Educator [facility]  Instruction Review Code 1- Verbalizes Understanding       Falls Prevention: - Provides verbal and written material to individual with discussion of falls prevention and safety. Flowsheet Row Cardiac Rehab from 06/18/2023 in [facility] Cardiac  and Pulmonary Rehab  Date 06/13/23  Educator [facility]  Instruction Review Code 1- Verbalizes Understanding       Other: -Provides group and verbal instruction on various topics (see comments)   Knowledge Questionnaire Score:  Knowledge Questionnaire Score - 06/18/23 1541       Knowledge Questionnaire Score   Pre Score 21/26             Core Components/Risk Factors/Patient Goals at Admission:  Personal Goals and Risk Factors at Admission - 06/13/23 1433       Core Components/Risk Factors/Patient Goals on Admission    Weight Management Yes;Weight Maintenance    Intervention Weight Management: Develop a combined nutrition and exercise program designed  to reach desired caloric intake, while maintaining appropriate intake of nutrient and fiber, sodium and fats, and appropriate energy expenditure required for the weight goal.;Weight Management: Provide education and appropriate resources to help participant work on and attain dietary goals.;Weight Management/Obesity: Establish reasonable short term and long term weight goals.    Expected Outcomes Short Term: Continue to assess and modify interventions until short term weight is achieved;Weight Maintenance: Understanding of the daily nutrition guidelines, which includes 25-35% calories from fat, 7% or less cal from saturated fats, less than 200mg  cholesterol, less than 1.5gm of sodium, & 5 or more servings of fruits and vegetables daily;Understanding recommendations for meals to include 15-35% energy as protein, 25-35% energy from fat, 35-60% energy from carbohydrates, less than 200mg  of dietary cholesterol, 20-35 gm of total fiber daily;Understanding of distribution of calorie intake throughout the day with the consumption of 4-5 meals/snacks;Long Term: Adherence to nutrition and physical activity/exercise program aimed toward attainment of established weight goal    Heart Failure Yes    Intervention Provide a combined exercise and nutrition program that is supplemented with education, support and counseling about heart failure. Directed toward relieving symptoms such as shortness of breath, decreased exercise tolerance, and extremity edema.    Expected Outcomes Improve functional capacity of life;Short term: Attendance in program 2-3 days a week with increased exercise capacity. Reported lower sodium intake. Reported increased fruit and vegetable intake. Reports medication compliance.;Short term: Daily weights obtained and reported for increase. Utilizing diuretic protocols set by physician.;Long term: Adoption of self-care skills and reduction of barriers for early signs and symptoms recognition and  intervention leading to self-care maintenance.    Hypertension Yes    Intervention Provide education on lifestyle modifcations including regular physical activity/exercise, weight management, moderate sodium restriction and increased consumption of fresh fruit, vegetables, and low fat dairy, alcohol moderation, and smoking cessation.;Monitor prescription use compliance.    Expected Outcomes Short Term: Continued assessment and intervention until BP is < 140/73mm HG in hypertensive participants. < 130/68mm HG in hypertensive participants with diabetes, heart failure or chronic kidney disease.;Long Term: Maintenance of blood pressure at goal levels.    Lipids Yes    Intervention Provide education and support for participant on nutrition & aerobic/resistive exercise along with prescribed medications to achieve LDL 70mg , HDL >40mg .    Expected Outcomes Short Term: Participant states understanding of desired cholesterol values and is compliant with medications prescribed. Participant is following exercise prescription and nutrition guidelines.;Long Term: Cholesterol controlled with medications as prescribed, with individualized exercise RX and with personalized nutrition plan. Value goals: LDL < 70mg , HDL > 40 mg.             Education:Diabetes - Individual verbal and written instruction to review signs/symptoms of diabetes, desired ranges of glucose level fasting, after  meals and with exercise. Acknowledge that pre and post exercise glucose checks will be done for 3 sessions at entry of program.   Core Components/Risk Factors/Patient Goals Review:   Goals and Risk Factor Review     Row Name 07/09/23 1121 08/01/23 1154 08/17/23 1119         Core Components/Risk Factors/Patient Goals Review   Personal Goals Review Hypertension Hypertension;Lipids;Heart Failure Weight Management/Obesity;Hypertension     Review Patient reports taking BP at home. He has no current concerns about managing his BP.  Patient reports that he continues to monitor BP and weight at home. His weight has been consistent and he also monitors for signs of fluid retention  and heart failure. He reports not current concerns. He takes all BP and cholesterol meds and reports no concerns with his medications. Champion is maintaining his weight and checking his blood pressure at home. His pressure very rarely goes over 110 systolic at home. His pressures in the program has been good and averages more on the lower side. He states sometimes when he gets dizzy he will sit back down or drink more water.     Expected Outcomes Short: continue to check BP at home. Long: control cardiac risk factors. Short: continue to monitor weight and blood pressure at home consistently. Long: continue to control cardiac risk factors. Short: continue to monitor blood pressure and drink fluids when needed. Long: maintain blood pressure readings independently.              Core Components/Risk Factors/Patient Goals at Discharge (Final Review):   Goals and Risk Factor Review - 08/17/23 1119       Core Components/Risk Factors/Patient Goals Review   Personal Goals Review Weight Management/Obesity;Hypertension    Review Shane Peters is maintaining his weight and checking his blood pressure at home. His pressure very rarely goes over 110 systolic at home. His pressures in the program has been good and averages more on the lower side. He states sometimes when he gets dizzy he will sit back down or drink more water.    Expected Outcomes Short: continue to monitor blood pressure and drink fluids when needed. Long: maintain blood pressure readings independently.             ITP Comments:  ITP Comments     Row Name 06/13/23 1430 06/18/23 1534 07/02/23 1153 07/11/23 1254 08/08/23 0756   ITP Comments Virtual Visit completed. Patient informed on EP and RD appointment and 6 Minute walk test. Patient also informed of patient health questionnaires on My Chart.  Patient Verbalizes understanding. Visit diagnosis can be found in [facility] 02/25/2023. Completed and gym orientation. Initial ITP created and sent for review to Dr. Daniel Nones, Medical Director. First full day of exercise!  Patient was oriented to gym and equipment including functions, settings, policies, and procedures.  Patient's individual exercise prescription and treatment plan were reviewed.  All starting workloads were established based on the results of the 6 minute walk test done at initial orientation visit.  The plan for exercise progression was also introduced and progression will be customized based on patient's performance and goals. 30 Day review completed. Medical Director ITP review done, changes made as directed, and signed approval by Medical Director.    new to program 30 Day review completed. Medical Director ITP review done, changes made as directed, and signed approval by Medical Director.    Row Name 08/28/23 1344           ITP Comments  Shane Peters's wife called to inform staff that he is in the hospital again due to falls and fluid buildup in his lungs. She requested that we discharge him at this time as she does not know when he will be healthy enough to return. He has completed 18 of 36 sessions.                Comments: Early Discharge ITP
